# Patient Record
Sex: Female | Born: 1935 | Race: White | Hispanic: No | Smoking: Never smoker
Health system: Southern US, Community
[De-identification: ages and names within clinical notes are randomized; demographics above are authoritative.]

## PROBLEM LIST (undated history)

## (undated) DIAGNOSIS — R221 Localized swelling, mass and lump, neck: Secondary | ICD-10-CM

## (undated) DIAGNOSIS — E785 Hyperlipidemia, unspecified: Secondary | ICD-10-CM

## (undated) DIAGNOSIS — C4491 Basal cell carcinoma of skin, unspecified: Secondary | ICD-10-CM

## (undated) DIAGNOSIS — C8590 Non-Hodgkin lymphoma, unspecified, unspecified site: Secondary | ICD-10-CM

## (undated) DIAGNOSIS — Z6711 Type A blood, Rh negative: Secondary | ICD-10-CM

## (undated) DIAGNOSIS — C541 Malignant neoplasm of endometrium: Secondary | ICD-10-CM

## (undated) DIAGNOSIS — I10 Essential (primary) hypertension: Secondary | ICD-10-CM

## (undated) DIAGNOSIS — I8390 Asymptomatic varicose veins of unspecified lower extremity: Secondary | ICD-10-CM

## (undated) HISTORY — PX: CATARACT EXTRACTION: SUR2

## (undated) HISTORY — PX: VAGINAL HYSTERECTOMY: SHX2639

## (undated) HISTORY — DX: Localized swelling, mass and lump, neck: R22.1

## (undated) HISTORY — DX: Type A blood, Rh negative: Z67.11

## (undated) HISTORY — DX: Hyperlipidemia, unspecified: E78.5

## (undated) HISTORY — DX: Non-Hodgkin lymphoma, unspecified, unspecified site: C85.90

## (undated) HISTORY — PX: ABDOMINAL HYSTERECTOMY: SHX81

## (undated) HISTORY — PX: LYMPH NODE BIOPSY: SHX201

## (undated) HISTORY — DX: Basal cell carcinoma of skin, unspecified: C44.91

## (undated) HISTORY — PX: TONSILLECTOMY: SHX5217

## (undated) HISTORY — DX: Malignant neoplasm of endometrium: C54.1

## (undated) HISTORY — DX: Essential (primary) hypertension: I10

## (undated) HISTORY — DX: Asymptomatic varicose veins of unspecified lower extremity: I83.90

---

## 1997-04-08 ENCOUNTER — Other Ambulatory Visit: Admission: RE | Admit: 1997-04-08 | Discharge: 1997-04-08 | Payer: Self-pay | Admitting: Gynecology

## 1997-12-09 ENCOUNTER — Other Ambulatory Visit: Admission: RE | Admit: 1997-12-09 | Discharge: 1997-12-09 | Payer: Self-pay | Admitting: Gynecology

## 1998-05-26 ENCOUNTER — Other Ambulatory Visit: Admission: RE | Admit: 1998-05-26 | Discharge: 1998-05-26 | Payer: Self-pay | Admitting: Gynecology

## 1998-12-03 ENCOUNTER — Other Ambulatory Visit: Admission: RE | Admit: 1998-12-03 | Discharge: 1998-12-03 | Payer: Self-pay | Admitting: Gynecology

## 1999-05-27 ENCOUNTER — Other Ambulatory Visit: Admission: RE | Admit: 1999-05-27 | Discharge: 1999-05-27 | Payer: Self-pay | Admitting: Gynecology

## 1999-12-14 ENCOUNTER — Other Ambulatory Visit: Admission: RE | Admit: 1999-12-14 | Discharge: 1999-12-14 | Payer: Self-pay | Admitting: Gynecology

## 2000-05-09 ENCOUNTER — Other Ambulatory Visit: Admission: RE | Admit: 2000-05-09 | Discharge: 2000-05-09 | Payer: Self-pay | Admitting: Gynecology

## 2000-08-22 ENCOUNTER — Ambulatory Visit (HOSPITAL_COMMUNITY): Admission: RE | Admit: 2000-08-22 | Discharge: 2000-08-22 | Payer: Self-pay | Admitting: Gastroenterology

## 2001-05-15 ENCOUNTER — Other Ambulatory Visit: Admission: RE | Admit: 2001-05-15 | Discharge: 2001-05-15 | Payer: Self-pay | Admitting: Gynecology

## 2001-09-10 ENCOUNTER — Other Ambulatory Visit: Admission: RE | Admit: 2001-09-10 | Discharge: 2001-09-10 | Payer: Self-pay | Admitting: Gynecology

## 2002-05-19 ENCOUNTER — Other Ambulatory Visit: Admission: RE | Admit: 2002-05-19 | Discharge: 2002-05-19 | Payer: Self-pay | Admitting: Gynecology

## 2003-05-20 ENCOUNTER — Other Ambulatory Visit: Admission: RE | Admit: 2003-05-20 | Discharge: 2003-05-20 | Payer: Self-pay | Admitting: Gynecology

## 2004-05-23 ENCOUNTER — Other Ambulatory Visit: Admission: RE | Admit: 2004-05-23 | Discharge: 2004-05-23 | Payer: Self-pay | Admitting: Gynecology

## 2005-05-31 ENCOUNTER — Other Ambulatory Visit: Admission: RE | Admit: 2005-05-31 | Discharge: 2005-05-31 | Payer: Self-pay | Admitting: Gynecology

## 2006-06-04 ENCOUNTER — Other Ambulatory Visit: Admission: RE | Admit: 2006-06-04 | Discharge: 2006-06-04 | Payer: Self-pay | Admitting: Gynecology

## 2006-08-29 ENCOUNTER — Emergency Department (HOSPITAL_COMMUNITY): Admission: EM | Admit: 2006-08-29 | Discharge: 2006-08-29 | Payer: Self-pay | Admitting: Emergency Medicine

## 2007-11-18 HISTORY — PX: OTHER SURGICAL HISTORY: SHX169

## 2010-05-20 NOTE — Procedures (Signed)
[facility]  Patient:    Melissa Mccormick, Melissa Mccormick Visit Number: [id number] MRN: [id number]          Service Type: Attending:  Verlin Grills, M.D. Proc. Date: 08/22/00   CC:         Al Decant. Janey Greaser, M.D.  Gretta Cool, M.D.   Procedure Report  PROCEDURE:  Colonoscopy  REFERRING PHYSICIAN:  Dr. Doran Clay and Dr. Beather Arbour.  INDICATION FOR PROCEDURE:  Ms. Melissa Mccormick (DOB:  [birth year]) is a 77 year old female who is referred for her first surveillance colonoscopy with polypectomy to prevent colon cancer. I discussed with Ms. Melissa Mccormick the complications associated with colonoscopy and polypectomy including a 15/1000 risk of bleeding and 04/998 risk of colon rupture requiring surgical repair. Ms. Melissa Mccormick has signed the operative permit.  ENDOSCOPIST:  Verlin Grills, M.D.  PREMEDICATION:  Demerol 50 mg, Versed 5 mg.  ENDOSCOPE:  Olympus pediatric colonoscope.  DESCRIPTION OF PROCEDURE:  After obtaining informed consent, the patient was placed in the left lateral decubitus position. I administered intravenous Demerol and intravenous Versed to achieve conscious sedation for the procedure. The patients cardiac rhythm, oxygen saturation and blood pressure were monitored throughout the procedure and documented in the medical record.  Anal inspection was normal. Digital rectal exam was normal. The Olympus pediatric video colonoscope was introduced into the rectum and easily advanced to the cecum. Colonic preparation for the exam today was satisfactory.  [name] has universal colonic diverticulosis but no signs of diverticulitis.  RECTUM:  Normal.  SIGMOID COLON/DESCENDING COLON:  Normal.  SPLENIC FLEXURE:  Normal.  TRANSVERSE COLON:  Normal.  HEPATIC FLEXURE:  Normal.  ASCENDING COLON:  Normal.  CECUM/ILEOCECAL VALVE:  Normal.  ASSESSMENT:  Universal colonic diverticulosis; otherwise normal proctocolonoscopy to the cecum. No  endoscopic evidence for the presence of colorectal neoplasia. Attending:  Verlin Grills, M.D. DD:  08/22/00 TD:  08/22/00 Job: [id number]

## 2010-06-22 ENCOUNTER — Ambulatory Visit
Admission: RE | Admit: 2010-06-22 | Discharge: 2010-06-22 | Disposition: A | Discharge status: Home / Self Care | Payer: Medicare Other | Source: Ambulatory Visit | Attending: Gynecology | Admitting: Gynecology

## 2010-06-22 ENCOUNTER — Other Ambulatory Visit: Payer: Self-pay | Admitting: Gynecology

## 2010-06-22 DIAGNOSIS — R599 Enlarged lymph nodes, unspecified: Secondary | ICD-10-CM

## 2010-07-11 ENCOUNTER — Encounter (INDEPENDENT_AMBULATORY_CARE_PROVIDER_SITE_OTHER): Payer: Self-pay | Admitting: General Surgery

## 2010-07-12 ENCOUNTER — Ambulatory Visit (INDEPENDENT_AMBULATORY_CARE_PROVIDER_SITE_OTHER): Payer: Medicare Other | Admitting: General Surgery

## 2010-07-12 ENCOUNTER — Encounter (INDEPENDENT_AMBULATORY_CARE_PROVIDER_SITE_OTHER): Payer: Self-pay | Admitting: General Surgery

## 2010-07-12 VITALS — BP 142/86 | HR 76 | Temp 97.5°F | Ht 64.0 in | Wt 158.8 lb

## 2010-07-12 DIAGNOSIS — R221 Localized swelling, mass and lump, neck: Secondary | ICD-10-CM

## 2010-07-12 LAB — BASIC METABOLIC PANEL
BUN: 16 mg/dL (ref 6–23)
CO2: 26 mEq/L (ref 19–32)
Chloride: 102 mEq/L (ref 96–112)
Creat: 0.82 mg/dL (ref 0.50–1.10)
Glucose, Bld: 100 mg/dL — ABNORMAL HIGH (ref 70–99)

## 2010-07-12 NOTE — Progress Notes (Signed)
Subjective:     Patient ID: Melissa Mccormick, female   DOB: 13-May-1924, 77 y.o.   MRN: [id number]    BP 142/86  Pulse 76  Temp(Src) 97.5 F (36.4 C) (Temporal)  Ht 5\' 4"  (1.626 m)  Wt 158 lb 12.8 oz (72.031 kg)  BMI 27.26 kg/m2    HPI This is an 77 year old female who presents with a left mastectomy mass for about a year. About 9 months ago she had an upper respiratory tract infection and was seen in urgent care. She had bilateral lymphadenopathy according to her at that point. She was treated with a Z-Pak. She followed up with them about 6 weeks later and still had this lymphadenopathy present. Persistent of present just in one spot in her left posterior neck. This area does not bother her there are no symptoms associated with it. It is mobile. She has no real change in her size. She reports no change in her weight and no night sweats. She reports no fevers associated with this either. She comes in today due to persistence of this lymphadenopathy for evaluation.  Past Medical History  Diagnosis Date  . Hypertension   . Adenocarcinoma of endometrium     Past Surgical History  Procedure Date  . Vaginal hysterectomy     Current Outpatient Prescriptions  Medication Sig Dispense Refill  . aspirin 81 MG tablet Take 81 mg by mouth daily.        . calcium citrate-vitamin D (CITRACAL+D) 315-200 MG-UNIT per tablet Take 1 tablet by mouth 2 (two) times daily.        . diphenhydramine-acetaminophen (TYLENOL PM) 25-500 MG TABS Take 1 tablet by mouth at bedtime as needed.        Marland Kitchen estradiol (VIVELLE-DOT) 0.0375 MG/24HR Place 1 patch onto the skin 2 (two) times a week.        . fluticasone (FLONASE) 50 MCG/ACT nasal spray       . hydrochlorothiazide (,MICROZIDE/HYDRODIURIL,) 12.5 MG capsule       . loratadine (CLARITIN) 10 MG tablet Take 10 mg by mouth daily.        Marland Kitchen losartan (COZAAR) 25 MG tablet       . Multiple Vitamin (MULTI-VITAMIN PO) Take by mouth.        . psyllium (METAMUCIL) 58.6 % powder  Take 1 packet by mouth 3 (three) times daily.        . valACYclovir (VALTREX) 500 MG tablet Take 500 mg by mouth 2 (two) times daily.          Allergies  Allergen Reactions  . Codeine   . Tape     History  Substance Use Topics  . Smoking status: Never Smoker   . Smokeless tobacco: Not on file  . Alcohol Use: No      Review of Systems  All other systems reviewed and are negative.       Objective:   Physical Exam  Constitutional: She appears well-developed and well-nourished.  Neck: Neck supple. No JVD present. No tracheal deviation present. No thyromegaly present.  Cardiovascular: Normal rate, regular rhythm and normal heart sounds.   Pulmonary/Chest: Effort normal and breath sounds normal.  Lymphadenopathy:    She has cervical adenopathy (left posterior triangle with 1 cm palpable mobile nontender node).       Assessment:     Left cervical lymphadenopathy present for one year    Plan:        I discussed with her today that we are  likely to need to excise this area given his persistence well as its appearance. I think this is most likely going to be benign but we can't be sure it less we excise  this. I told her that I would obtain a CT scan of her neck just to make sure this is the only disease is present. She's got a followup with me next week after the CT scan to go over this and likely schedule the procedure.

## 2010-07-14 ENCOUNTER — Ambulatory Visit
Admission: RE | Admit: 2010-07-14 | Discharge: 2010-07-14 | Disposition: A | Discharge status: Home / Self Care | Payer: Medicare Other | Source: Ambulatory Visit | Attending: General Surgery | Admitting: General Surgery

## 2010-07-14 DIAGNOSIS — R221 Localized swelling, mass and lump, neck: Secondary | ICD-10-CM

## 2010-07-14 MED ORDER — IOHEXOL 300 MG/ML  SOLN
75.0000 mL | Freq: Once | INTRAMUSCULAR | Status: AC | PRN
  Administered 2010-07-14: 75 mL via INTRAVENOUS

## 2010-07-15 ENCOUNTER — Telehealth (INDEPENDENT_AMBULATORY_CARE_PROVIDER_SITE_OTHER): Payer: Self-pay | Admitting: General Surgery

## 2010-07-21 ENCOUNTER — Telehealth (INDEPENDENT_AMBULATORY_CARE_PROVIDER_SITE_OTHER): Payer: Self-pay

## 2010-07-21 NOTE — Telephone Encounter (Signed)
Returned voicemail message to pt but had to leave a message notifying her that I would fax a copy of her lab and CT to her medical docotor Dr Juluis Rainier. If any questions to please call me back/[facility] 07-21-10

## 2010-08-08 ENCOUNTER — Ambulatory Visit (INDEPENDENT_AMBULATORY_CARE_PROVIDER_SITE_OTHER): Payer: Medicare Other | Admitting: General Surgery

## 2010-08-08 ENCOUNTER — Encounter (INDEPENDENT_AMBULATORY_CARE_PROVIDER_SITE_OTHER): Payer: Self-pay | Admitting: General Surgery

## 2010-08-08 DIAGNOSIS — R590 Localized enlarged lymph nodes: Secondary | ICD-10-CM

## 2010-08-08 DIAGNOSIS — R599 Enlarged lymph nodes, unspecified: Secondary | ICD-10-CM

## 2010-08-08 NOTE — Progress Notes (Signed)
Melissa Mccormick is a 77 y.o. female.    Chief Complaint  Patient presents with  . Follow-up    HPI HPI This is an 77 year old female who presents with a left neck mass for about a year. About 10 months ago she had an upper respiratory tract infection and was seen in urgent care. She had bilateral lymphadenopathy according to her at that point. She was treated with a Z-Pak. She followed up with them about 6 weeks later and still had this lymphadenopathy present. Persistent of present just in one spot in her left posterior neck. This area does not bother her there are no symptoms associated with it. It is mobile. She has no real change in her size. She reports no change in her weight and no night sweats. She reports no fevers associated with this either. I obtained a ct scan with one abnormal node.  No other abnormalities.  She reports no change since last visit.  She does apparently have an elevated CRP on routine bloodwork that she is seeing Melissa Mccormick for next week.   CT report  Left posterior triangle level III enlarged lymph node  with maximal transverse dimension of 1.2 x 1 cm. This is worrisome  for possibility of malignancy.  There are scattered normal sized lymph nodes throughout the  remainder of neck and the upper mediastinum.  Primary neck mass as a source for the adenopathy is not identified.  Subtle mucosal abnormality not excluded requiring direct  visualization.   Past Medical History  Diagnosis Date  . Hypertension   . Adenocarcinoma of endometrium     Past Surgical History  Procedure Date  . Vaginal hysterectomy     No family history on file.  Social History History  Substance Use Topics  . Smoking status: Never Smoker   . Smokeless tobacco: Not on file  . Alcohol Use: No    Allergies  Allergen Reactions  . Codeine   . Tape     Current Outpatient Prescriptions  Medication Sig Dispense Refill  . aspirin 81 MG tablet Take 81 mg by mouth daily.         . calcium citrate-vitamin D (CITRACAL+D) 315-200 MG-UNIT per tablet Take 1 tablet by mouth 2 (two) times daily.        . diphenhydramine-acetaminophen (TYLENOL PM) 25-500 MG TABS Take 1 tablet by mouth at bedtime.       Marland Kitchen estradiol (VIVELLE-DOT) 0.0375 MG/24HR Place 1 patch onto the skin 2 (two) times a week.        . fluticasone (FLONASE) 50 MCG/ACT nasal spray as needed.       . hydrochlorothiazide (,MICROZIDE/HYDRODIURIL,) 12.5 MG capsule       . loratadine (CLARITIN) 10 MG tablet Take 10 mg by mouth daily.        Marland Kitchen losartan (COZAAR) 25 MG tablet 25 mg daily.       . Multiple Vitamin (MULTI-VITAMIN PO) Take by mouth.        . psyllium (METAMUCIL) 58.6 % powder Take 1 packet by mouth 3 (three) times daily. 1 tsp with water in the morning      . valACYclovir (VALTREX) 500 MG tablet Take 500 mg by mouth as needed.         Review of Systems Review of Systems  All other systems reviewed and are negative.    Physical Exam Physical Exam  Constitutional: She appears well-developed and well-nourished.  Neck: Neck supple.  Cardiovascular: Normal rate, regular rhythm  and normal heart sounds.   Respiratory: Effort normal and breath sounds normal.  Lymphadenopathy:    She has cervical adenopathy (left posterior triangle 1 x1 cm mobile nontender node, no other adenopathy).     There were no vitals taken for this visit.  Assessment/Plan Persistent left neck adenopathy   We discussed a biopsy of this left neck mass. I think this is likely going to be benign but I discussed with her the only way to ensure that would be to excise it. She is perfectly happy with that plan. I discussed risks including but not limited to bleeding, infection, nerve injury. She understands this and will proceed after she sees Melissa Mccormick next week. Melissa Mccormick 08/08/2010, 11:21 AM

## 2010-08-19 ENCOUNTER — Telehealth (INDEPENDENT_AMBULATORY_CARE_PROVIDER_SITE_OTHER): Payer: Self-pay

## 2010-08-19 NOTE — Telephone Encounter (Signed)
[name] for pt letting her know that I did contact her doctor's office Dr Anne Fu requesting clearance on her to be faxed for her surgery on 09-02-10. I notified Dr Judd Gaudier office to fax clearance ASAP b/c her surgery is for 09-02-10.Melissa Mccormick

## 2010-08-22 ENCOUNTER — Telehealth (INDEPENDENT_AMBULATORY_CARE_PROVIDER_SITE_OTHER): Payer: Self-pay

## 2010-08-22 NOTE — Telephone Encounter (Signed)
Pt called to notify her that we did receive clearance from Dr Anne Fu for her surgery posted on 09-02-10./ [facility]

## 2010-08-30 ENCOUNTER — Encounter (HOSPITAL_BASED_OUTPATIENT_CLINIC_OR_DEPARTMENT_OTHER)
Admission: RE | Admit: 2010-08-30 | Discharge: 2010-08-30 | Disposition: A | Discharge status: Home / Self Care | Payer: Medicare Other | Source: Ambulatory Visit | Attending: General Surgery | Admitting: General Surgery

## 2010-08-30 LAB — DIFFERENTIAL
Basophils Absolute: 0 10*3/uL (ref 0.0–0.1)
Basophils Relative: 0 % (ref 0–1)
Eosinophils Absolute: 0.1 10*3/uL (ref 0.0–0.7)
Eosinophils Relative: 1 % (ref 0–5)
Lymphocytes Relative: 31 % (ref 12–46)
Lymphs Abs: 2.9 10*3/uL (ref 0.7–4.0)
Monocytes Absolute: 0.6 10*3/uL (ref 0.1–1.0)
Monocytes Relative: 7 % (ref 3–12)
Neutro Abs: 5.6 10*3/uL (ref 1.7–7.7)
Neutrophils Relative %: 61 % (ref 43–77)

## 2010-08-30 LAB — CBC
HCT: 40.1 % (ref 36.0–46.0)
Hemoglobin: 14 g/dL (ref 12.0–15.0)
MCH: 31.3 pg (ref 26.0–34.0)
MCHC: 34.9 g/dL (ref 30.0–36.0)
MCV: 89.5 fL (ref 78.0–100.0)
Platelets: 217 10*3/uL (ref 150–400)
RBC: 4.48 MIL/uL (ref 3.87–5.11)
RDW: 12.8 % (ref 11.5–15.5)
WBC: 9.3 10*3/uL (ref 4.0–10.5)

## 2010-08-30 LAB — BASIC METABOLIC PANEL
BUN: 16 mg/dL (ref 6–23)
Calcium: 9.7 mg/dL (ref 8.4–10.5)
GFR calc Af Amer: >60 mL/min (ref >60)
GFR calc non Af Amer: >60 mL/min (ref >60)
Potassium: 4.1 mEq/L (ref 3.5–5.1)
Sodium: 139 mEq/L (ref 135–145)

## 2010-09-02 ENCOUNTER — Other Ambulatory Visit (INDEPENDENT_AMBULATORY_CARE_PROVIDER_SITE_OTHER): Payer: Self-pay | Admitting: General Surgery

## 2010-09-02 ENCOUNTER — Ambulatory Visit (HOSPITAL_BASED_OUTPATIENT_CLINIC_OR_DEPARTMENT_OTHER)
Admission: RE | Admit: 2010-09-02 | Discharge: 2010-09-02 | Disposition: A | Discharge status: Home / Self Care | Payer: Medicare Other | Source: Ambulatory Visit | Attending: General Surgery | Admitting: General Surgery

## 2010-09-02 DIAGNOSIS — Z01810 Encounter for preprocedural cardiovascular examination: Secondary | ICD-10-CM | POA: Insufficient documentation

## 2010-09-02 DIAGNOSIS — C8581 Other specified types of non-Hodgkin lymphoma, lymph nodes of head, face, and neck: Secondary | ICD-10-CM

## 2010-09-02 DIAGNOSIS — M129 Arthropathy, unspecified: Secondary | ICD-10-CM | POA: Insufficient documentation

## 2010-09-02 DIAGNOSIS — Z01812 Encounter for preprocedural laboratory examination: Secondary | ICD-10-CM | POA: Insufficient documentation

## 2010-09-02 DIAGNOSIS — R599 Enlarged lymph nodes, unspecified: Secondary | ICD-10-CM | POA: Insufficient documentation

## 2010-09-02 DIAGNOSIS — I10 Essential (primary) hypertension: Secondary | ICD-10-CM | POA: Insufficient documentation

## 2010-09-05 NOTE — Op Note (Signed)
  NAMEAISA, Melissa Mccormick NO.:  [id number]  MEDICAL RECORD NO.:  [id number]  LOCATION:                                 FACILITY:  PHYSICIAN:  Juanetta Gosling, MDDATE OF BIRTH:  07-05-1924  DATE OF PROCEDURE:  09/02/2010 DATE OF DISCHARGE:                              OPERATIVE REPORT   PREOPERATIVE DIAGNOSIS:  Left neck lymphadenopathy, persistent.  POSTOPERATIVE DIAGNOSIS:  Left neck lymphadenopathy, persistent.  PROCEDURE:  Left neck mass excisional biopsy.  SURGEON:  Troy Sine. Dwain Sarna, MD  ASSISTANT:  None.  ANESTHESIA:  General.  SPECIMENS:  Left neck node to Pathology fresh.  ESTIMATED BLOOD LOSS:  Minimal.  COMPLICATIONS:  None.  DRAINS:  None.  DISPOSITION:  The patient to recovery room in stable condition.  INDICATIONS:  An 77 year old female with a left neck mass for about a year when I had seen her she had had an upper respiratory tract infection was treated at that point, but this node has persisted, it is mobile, there was no real change, I did obtain a CT scan that really did show a left posterior triangle level III enlarged node, it did appear worrisome for malignancy.  This was felt on my examination as well.  She and I discussed a biopsy after she was evaluated by both Dr. Anne Fu and [name]  DESCRIPTION OF PROCEDURE:  After informed consent was obtained, the patient was taken to the operating room, she was administered 1 g of intravenous cefazolin.  Sequential compression devices were placed on lower extremities prior to induction with anesthesia.  She was placed under general anesthesia with an endotracheal tube.  She was then prepped and draped in standard sterile surgical fashion.  A surgical time-out was then performed.  I made a vertical incision overlying the lymph node.  I then dissected down using tenotomy scissors to the level of the platysma this was spread open.  I then identified the lymph node which was  carefully removed from the surrounding tissue.  I did place several clips for some draining lymphatics and the lymph node was excised in its entirety.  Hemostasis was observed.  I then closed the platysma 3-0 Vicryl.  The dermis with 3-0 Vicryl.  The skin with 4-0 Monocryl. Dermabond was placed over this.  She tolerated this well, was extubated, transferred to recovery room in stable condition.     Juanetta Gosling, MD     MCW/MEDQ  D:  09/02/2010  T:  09/02/2010  Job:  [id number]  cc:   Dr. Harmon Dun, MD  Electronically Signed by Emelia Loron MD on 09/05/2010 81:19:14 PM

## 2010-09-08 ENCOUNTER — Other Ambulatory Visit (INDEPENDENT_AMBULATORY_CARE_PROVIDER_SITE_OTHER): Payer: Self-pay | Admitting: General Surgery

## 2010-09-08 DIAGNOSIS — R221 Localized swelling, mass and lump, neck: Secondary | ICD-10-CM

## 2010-09-09 ENCOUNTER — Other Ambulatory Visit (INDEPENDENT_AMBULATORY_CARE_PROVIDER_SITE_OTHER): Payer: Self-pay | Admitting: General Surgery

## 2010-09-09 DIAGNOSIS — C8590 Non-Hodgkin lymphoma, unspecified, unspecified site: Secondary | ICD-10-CM

## 2010-09-14 ENCOUNTER — Ambulatory Visit (HOSPITAL_COMMUNITY)
Admission: RE | Admit: 2010-09-14 | Discharge: 2010-09-14 | Disposition: A | Discharge status: Home / Self Care | Payer: Medicare Other | Source: Ambulatory Visit | Attending: General Surgery | Admitting: General Surgery

## 2010-09-14 DIAGNOSIS — J984 Other disorders of lung: Secondary | ICD-10-CM | POA: Insufficient documentation

## 2010-09-14 DIAGNOSIS — C8590 Non-Hodgkin lymphoma, unspecified, unspecified site: Secondary | ICD-10-CM

## 2010-09-14 DIAGNOSIS — K7689 Other specified diseases of liver: Secondary | ICD-10-CM | POA: Insufficient documentation

## 2010-09-14 DIAGNOSIS — C8589 Other specified types of non-Hodgkin lymphoma, extranodal and solid organ sites: Secondary | ICD-10-CM | POA: Insufficient documentation

## 2010-09-14 DIAGNOSIS — K8689 Other specified diseases of pancreas: Secondary | ICD-10-CM | POA: Insufficient documentation

## 2010-09-14 DIAGNOSIS — K5730 Diverticulosis of large intestine without perforation or abscess without bleeding: Secondary | ICD-10-CM | POA: Insufficient documentation

## 2010-09-14 DIAGNOSIS — I517 Cardiomegaly: Secondary | ICD-10-CM | POA: Insufficient documentation

## 2010-09-14 MED ORDER — IOHEXOL 300 MG/ML  SOLN
100.0000 mL | Freq: Once | INTRAMUSCULAR | Status: AC | PRN
  Administered 2010-09-14: 100 mL via INTRAVENOUS

## 2010-09-15 ENCOUNTER — Encounter: Payer: Medicare Other | Admitting: Oncology

## 2010-09-16 ENCOUNTER — Other Ambulatory Visit: Payer: Self-pay | Admitting: Oncology

## 2010-09-16 ENCOUNTER — Encounter (HOSPITAL_BASED_OUTPATIENT_CLINIC_OR_DEPARTMENT_OTHER): Payer: Medicare Other | Admitting: Oncology

## 2010-09-16 DIAGNOSIS — C8589 Other specified types of non-Hodgkin lymphoma, extranodal and solid organ sites: Secondary | ICD-10-CM

## 2010-09-16 LAB — COMPREHENSIVE METABOLIC PANEL
ALT: 11 U/L (ref 0–35)
AST: 16 U/L (ref 0–37)
Albumin: 3.9 g/dL (ref 3.5–5.2)
Alkaline Phosphatase: 63 U/L (ref 39–117)
BUN: 16 mg/dL (ref 6–23)
Calcium: 9.9 mg/dL (ref 8.4–10.5)
Chloride: 102 mEq/L (ref 96–112)
Potassium: 4.6 mEq/L (ref 3.5–5.3)
Sodium: 141 mEq/L (ref 135–145)
Total Protein: 6.2 g/dL (ref 6.0–8.3)

## 2010-09-16 LAB — CBC WITH DIFFERENTIAL/PLATELET
Basophils Absolute: 0 10*3/uL (ref 0.0–0.1)
EOS%: 1.3 % (ref 0.0–7.0)
HGB: 14.1 g/dL (ref 11.6–15.9)
MCH: 31.7 pg (ref 25.1–34.0)
NEUT#: 5 10*3/uL (ref 1.5–6.5)
RBC: 4.46 10*6/uL (ref 3.70–5.45)
RDW: 13.2 % (ref 11.2–14.5)
lymph#: 2 10*3/uL (ref 0.9–3.3)

## 2010-09-22 ENCOUNTER — Encounter (INDEPENDENT_AMBULATORY_CARE_PROVIDER_SITE_OTHER): Payer: Self-pay | Admitting: General Surgery

## 2010-09-22 ENCOUNTER — Ambulatory Visit (INDEPENDENT_AMBULATORY_CARE_PROVIDER_SITE_OTHER): Payer: Medicare Other | Admitting: General Surgery

## 2010-09-22 VITALS — BP 144/86 | HR 68 | Temp 97.1°F | Resp 16 | Ht 64.5 in | Wt 155.0 lb

## 2010-09-22 DIAGNOSIS — Z09 Encounter for follow-up examination after completed treatment for conditions other than malignant neoplasm: Secondary | ICD-10-CM

## 2010-09-22 NOTE — Progress Notes (Signed)
Subjective:     Patient ID: Melissa Mccormick, female   DOB: 1924-11-28, 77 y.o.   MRN: [id number]  HPI This is an 77 year old female who had a persistent left neck node. I biopsied this recently. She's had no problems since her operation. Her pathology did show a non-Hodgkin's lymphoma. CT scan of her neck chest abdomen and pelvis showed no other disease. She has been seen by Dr. Clelia Croft and is going to be followed from here.  Review of Systems     Objective:   Physical Exam Well healing left neck incision without infection    Assessment:     S/p left neck node biopsy Non-Hodgkins lymphoma    Plan:     Return to full activity May get her flu shot per her request Return as needed

## 2010-10-14 LAB — POCT CARDIAC MARKERS
Myoglobin, poc: 81.3
Operator id: 234501

## 2010-10-14 LAB — I-STAT 8, (EC8 V) (CONVERTED LAB)
BUN: 14
Chloride: 103
Glucose, Bld: 99
pCO2, Ven: 59.8 — ABNORMAL HIGH
pH, Ven: 7.331 — ABNORMAL HIGH

## 2010-10-14 LAB — POCT I-STAT CREATININE
Creatinine, Ser: 0.9
Operator id: 234501

## 2010-10-14 LAB — DIFFERENTIAL
Basophils Absolute: 0.1
Eosinophils Absolute: 0
Eosinophils Relative: 0
Lymphocytes Relative: 15
Neutrophils Relative %: 79 — ABNORMAL HIGH

## 2010-10-14 LAB — CBC
MCHC: 34.3
MCV: 91.3
Platelets: 236
RDW: 13.6

## 2010-10-14 LAB — HEPATITIS B SURFACE ANTIBODY,QUALITATIVE: Hep B S Ab: POSITIVE — AB

## 2010-12-20 ENCOUNTER — Ambulatory Visit (HOSPITAL_BASED_OUTPATIENT_CLINIC_OR_DEPARTMENT_OTHER): Payer: Medicare Other | Admitting: Oncology

## 2010-12-20 ENCOUNTER — Other Ambulatory Visit: Payer: Self-pay | Admitting: Oncology

## 2010-12-20 ENCOUNTER — Encounter: Payer: Self-pay | Admitting: *Deleted

## 2010-12-20 ENCOUNTER — Other Ambulatory Visit (HOSPITAL_BASED_OUTPATIENT_CLINIC_OR_DEPARTMENT_OTHER): Payer: Medicare Other | Admitting: Lab

## 2010-12-20 VITALS — BP 186/81 | HR 79 | Temp 97.2°F | Ht 64.5 in | Wt 155.0 lb

## 2010-12-20 DIAGNOSIS — C8590 Non-Hodgkin lymphoma, unspecified, unspecified site: Secondary | ICD-10-CM

## 2010-12-20 DIAGNOSIS — C8589 Other specified types of non-Hodgkin lymphoma, extranodal and solid organ sites: Secondary | ICD-10-CM

## 2010-12-20 LAB — CBC WITH DIFFERENTIAL/PLATELET
Eosinophils Absolute: 0.1 10*3/uL (ref 0.0–0.5)
LYMPH%: 31.9 % (ref 14.0–49.7)
MCHC: 34.9 g/dL (ref 31.5–36.0)
MCV: 91.1 fL (ref 79.5–101.0)
MONO%: 8.8 % (ref 0.0–14.0)
NEUT#: 4.6 10*3/uL (ref 1.5–6.5)
Platelets: 203 10*3/uL (ref 145–400)
RBC: 4.48 10*6/uL (ref 3.70–5.45)

## 2010-12-20 LAB — COMPREHENSIVE METABOLIC PANEL
Alkaline Phosphatase: 65 U/L (ref 39–117)
Glucose, Bld: 96 mg/dL (ref 70–99)
Sodium: 137 mEq/L (ref 135–145)
Total Bilirubin: 0.9 mg/dL (ref 0.3–1.2)
Total Protein: 6.4 g/dL (ref 6.0–8.3)

## 2010-12-20 NOTE — Progress Notes (Signed)
Hematology and Oncology Follow Up Visit  Amil Moseman [id number] 12/04/1924 77 y.o. 12/20/2010 12:25 PM    CC: Juanetta Gosling, MD  Dossie Der, MD  Jake Bathe, MD    Principle Diagnosis: This is an 77 year old female with stage 1A low-grade non-Hodgkin's lymphoma, marginal zone subtype diagnosed in 08/2010.   Prior Therapy: The patient is S/P an excisional biopsy that was done on September 02, 2010.  The case number was [id number] which showed a CD20 positive low grade non-Hodgkin's lymphoma and marginal zone subtype that is low grade and as mentioned showed a CD20 positive, CD79 positive without coexpression of CD5 and CD10.   Current therapy: Observation and surveillance.   Interim History:  The patient presents today for follow up for her of lymphoma and clinically feels well.  She is not reporting any chest pain.  She is not reporting any difficulty breathing.  Did not report any symptomatic dysphagia.  Did not report any fevers, chills, night sweats or weight loss.  Her energy performance status remains excellent.  She continues to live independently and attends to activities of daily living without any hindrance or decline. She continue to exercise regularly  without any difficulty.  Medications: I have reviewed the patient's current medications. Current outpatient prescriptions:aspirin 81 MG tablet, Take 81 mg by mouth daily.  , Disp: , Rfl: ;  calcium citrate-vitamin D (CITRACAL+D) 315-200 MG-UNIT per tablet, Take 1 tablet by mouth 2 (two) times daily.  , Disp: , Rfl: ;  diphenhydramine-acetaminophen (TYLENOL PM) 25-500 MG TABS, Take 1 tablet by mouth at bedtime. , Disp: , Rfl: ;  estradiol (VIVELLE-DOT) 0.0375 MG/24HR, Place 1 patch onto the skin 2 (two) times a week.  , Disp: , Rfl:  fluticasone (FLONASE) 50 MCG/ACT nasal spray, as needed. , Disp: , Rfl: ;  hydrochlorothiazide (,MICROZIDE/HYDRODIURIL,) 12.5 MG capsule, , Disp: , Rfl: ;  loratadine (CLARITIN) 10 MG tablet,  Take 10 mg by mouth daily.  , Disp: , Rfl: ;  losartan (COZAAR) 25 MG tablet, 25 mg daily. , Disp: , Rfl: ;  Multiple Vitamin (MULTI-VITAMIN PO), Take by mouth.  , Disp: , Rfl:  psyllium (METAMUCIL) 58.6 % powder, Take 1 packet by mouth 3 (three) times daily. 1 tsp with water in the morning, Disp: , Rfl: ;  valACYclovir (VALTREX) 500 MG tablet, Take 500 mg by mouth as needed. , Disp: , Rfl:   Allergies:  Allergies  Allergen Reactions  . Codeine   . Tape     Past Medical History, Surgical history, Social history, and Family History were reviewed and updated.  Review of Systems: Constitutional:  Negative for fever, chills, night sweats, anorexia, weight loss, pain. Cardiovascular: no chest pain or dyspnea on exertion Respiratory: no cough, shortness of breath, or wheezing Neurological: no TIA or stroke symptoms Dermatological: negative ENT: negative Skin: Negative. Gastrointestinal: no abdominal pain, change in bowel habits, or black or bloody stools Genito-Urinary: no dysuria, trouble voiding, or hematuria Hematological and Lymphatic: negative Breast: negative Musculoskeletal: negative Remaining ROS negative. Physical Exam: Blood pressure 186/81, pulse 79, temperature 97.2 F (36.2 C), temperature source Oral, height 5' 4.5" (1.638 m), weight 155 lb (70.308 kg). ECOG: 1 General appearance: alert Head: Normocephalic, without obvious abnormality, atraumatic Neck: no adenopathy, no carotid bruit, no JVD, supple, symmetrical, trachea midline and thyroid not enlarged, symmetric, no tenderness/mass/nodules Lymph nodes: Cervical, supraclavicular, and axillary nodes normal. Heart:regular rate and rhythm, S1, S2 normal, no murmur, click, rub or gallop Lung:chest clear, no  wheezing, rales, normal symmetric air entry Abdomin: soft, non-tender, without masses or organomegaly EXT:no erythema, induration, or nodules   Lab Results: Lab Results  Component Value Date   WBC 8.0 12/20/2010    HGB 14.3 12/20/2010   HCT 40.8 12/20/2010   MCV 91.1 12/20/2010   PLT 203 12/20/2010     Chemistry      Component Value Date/Time   NA 141 09/16/2010 1045   K 4.6 09/16/2010 1045   CL 102 09/16/2010 1045   CO2 31 09/16/2010 1045   BUN 16 09/16/2010 1045   CREATININE 0.89 09/16/2010 1045   CREATININE 0.82 07/12/2010 1001      Component Value Date/Time   CALCIUM 9.9 09/16/2010 1045   ALKPHOS 63 09/16/2010 1045   AST 16 09/16/2010 1045   ALT 11 09/16/2010 1045   BILITOT 0.8 09/16/2010 1045        Impression and Plan:   This is a pleasant 77 year old female with the following diagnosis:   1. Stage 1A low-grade non-Hodgkin's lymphoma, marginal zone subtype. Currently on observation. No indication for treatment at this time. I continued to educate her about symptoms of advanced lymphoma and indication of treatment for low grade lymphoma. She has non of these issues at this time. Plan for follow up in 4 months and repeat images in 8 months.  2. Remote history of the uterus: not active at this time.   3. Hypertension: followed by her PCP.  [facility], MD 12/18/201212:25 PM

## 2011-04-20 ENCOUNTER — Other Ambulatory Visit (HOSPITAL_BASED_OUTPATIENT_CLINIC_OR_DEPARTMENT_OTHER): Payer: Medicare Other | Admitting: Lab

## 2011-04-20 ENCOUNTER — Telehealth: Payer: Self-pay | Admitting: Oncology

## 2011-04-20 ENCOUNTER — Ambulatory Visit (HOSPITAL_BASED_OUTPATIENT_CLINIC_OR_DEPARTMENT_OTHER): Payer: Medicare Other | Admitting: Oncology

## 2011-04-20 VITALS — BP 153/80 | HR 82 | Temp 97.0°F | Ht 64.5 in | Wt 157.0 lb

## 2011-04-20 DIAGNOSIS — C8589 Other specified types of non-Hodgkin lymphoma, extranodal and solid organ sites: Secondary | ICD-10-CM

## 2011-04-20 DIAGNOSIS — C8590 Non-Hodgkin lymphoma, unspecified, unspecified site: Secondary | ICD-10-CM

## 2011-04-20 LAB — CBC WITH DIFFERENTIAL/PLATELET
BASO%: 0.6 % (ref 0.0–2.0)
Eosinophils Absolute: 0.2 10*3/uL (ref 0.0–0.5)
MONO#: 0.8 10*3/uL (ref 0.1–0.9)
NEUT#: 4.4 10*3/uL (ref 1.5–6.5)
RBC: 4.32 10*6/uL (ref 3.70–5.45)
RDW: 13.6 % (ref 11.2–14.5)
WBC: 8.8 10*3/uL (ref 3.9–10.3)
lymph#: 3.4 10*3/uL — ABNORMAL HIGH (ref 0.9–3.3)
nRBC: 0 % (ref 0–0)

## 2011-04-20 LAB — COMPREHENSIVE METABOLIC PANEL
Albumin: 3.8 g/dL (ref 3.5–5.2)
CO2: 30 mEq/L (ref 19–32)
Calcium: 9.1 mg/dL (ref 8.4–10.5)
Chloride: 104 mEq/L (ref 96–112)
Glucose, Bld: 86 mg/dL (ref 70–99)
Potassium: 3.9 mEq/L (ref 3.5–5.3)
Sodium: 143 mEq/L (ref 135–145)
Total Bilirubin: 0.7 mg/dL (ref 0.3–1.2)
Total Protein: 6.1 g/dL (ref 6.0–8.3)

## 2011-04-20 NOTE — Telephone Encounter (Signed)
appts made and printed for pt aom °

## 2011-04-20 NOTE — Progress Notes (Signed)
Hematology and Oncology Follow Up Visit  Melissa Mccormick [id number] Feb 21, 1924 78 y.o. 04/20/2011 3:17 PM    CC: Juanetta Gosling, MD  Dossie Der, MD  Jake Bathe, MD    Principle Diagnosis: This is an 78 year old female with stage 1A low-grade non-Hodgkin's lymphoma, marginal zone subtype diagnosed in 08/2010.   Prior Therapy: The patient is S/P an excisional biopsy that was done on September 02, 2010.  The case number was [id number] which showed a CD20 positive low grade non-Hodgkin's lymphoma and marginal zone subtype that is low grade and as mentioned showed a CD20 positive, CD79 positive without coexpression of CD5 and CD10.   Current therapy: Observation and surveillance.   Interim History:  The patient presents today for follow up for her of lymphoma and clinically feels well.  She is not reporting any chest pain.  She is not reporting any difficulty breathing.  Did not report any symptomatic dysphagia.  Did not report any fevers, chills, night sweats or weight loss.  Her energy performance status remains excellent.  She continues to live independently and attends to activities of daily living without any hindrance or decline. She continue to exercise regularly  without any difficulty. She is completely symptom free.   Medications: I have reviewed the patient's current medications. Current outpatient prescriptions:aspirin 81 MG tablet, Take 81 mg by mouth daily.  , Disp: , Rfl: ;  calcium citrate-vitamin D (CITRACAL+D) 315-200 MG-UNIT per tablet, Take 1 tablet by mouth 2 (two) times daily.  , Disp: , Rfl: ;  Carboxymethylcellulose Sodium (REFRESH OP), Apply to eye as needed.  , Disp: , Rfl: ;  Cholecalciferol (VITAMIN D-3 PO), Take 2,000 Int'l Units by mouth daily.  , Disp: , Rfl:  diphenhydramine-acetaminophen (TYLENOL PM) 25-500 MG TABS, Take 1 tablet by mouth at bedtime. , Disp: , Rfl: ;  estradiol (VIVELLE-DOT) 0.0375 MG/24HR, Place 1 patch onto the skin 2 (two) times a week.  ,  Disp: , Rfl: ;  fluticasone (FLONASE) 50 MCG/ACT nasal spray, as needed. , Disp: , Rfl: ;  loratadine (CLARITIN) 10 MG tablet, Take 10 mg by mouth daily. , Disp: , Rfl:  losartan-hydrochlorothiazide (HYZAAR) 50-12.5 MG per tablet, Take 1 tablet by mouth daily.  , Disp: , Rfl: ;  Multiple Vitamin (MULTI-VITAMIN PO), Take by mouth.  , Disp: , Rfl: ;  OMEGA-3 KRILL OIL 300 MG CAPS, Take 300 mg by mouth daily.  , Disp: , Rfl: ;  OVER THE COUNTER MEDICATION, Take by mouth as needed. Emergen C mix when exposed to colds or flu , Disp: , Rfl:  psyllium (METAMUCIL) 58.6 % powder, Take 1 packet by mouth 3 (three) times daily. 1 tsp with water in the morning, Disp: , Rfl: ;  valACYclovir (VALTREX) 500 MG tablet, Take 500 mg by mouth as needed. , Disp: , Rfl:   Allergies:  Allergies  Allergen Reactions  . Codeine   . Tape     Past Medical History, Surgical history, Social history, and Family History were reviewed and updated.  Review of Systems: Constitutional:  Negative for fever, chills, night sweats, anorexia, weight loss, pain. Cardiovascular: no chest pain or dyspnea on exertion Respiratory: no cough, shortness of breath, or wheezing Neurological: no TIA or stroke symptoms Dermatological: negative ENT: negative Skin: Negative. Gastrointestinal: no abdominal pain, change in bowel habits, or black or bloody stools Genito-Urinary: no dysuria, trouble voiding, or hematuria Hematological and Lymphatic: negative Breast: negative Musculoskeletal: negative Remaining ROS negative. Physical Exam: Blood pressure  153/80, pulse 82, temperature 97 F (36.1 C), temperature source Oral, height 5' 4.5" (1.638 m), weight 157 lb (71.215 kg). ECOG: 1 General appearance: alert Head: Normocephalic, without obvious abnormality, atraumatic Neck: no adenopathy, no carotid bruit, no JVD, supple, symmetrical, trachea midline and thyroid not enlarged, symmetric, no tenderness/mass/nodules Lymph nodes: Cervical,  supraclavicular, and axillary nodes normal. Heart:regular rate and rhythm, S1, S2 normal, no murmur, click, rub or gallop Lung:chest clear, no wheezing, rales, normal symmetric air entry Abdomin: soft, non-tender, without masses or organomegaly EXT:no erythema, induration, or nodules   Lab Results: Lab Results  Component Value Date   WBC 8.8 04/20/2011   HGB 13.3 04/20/2011   HCT 39.7 04/20/2011   MCV 91.8 04/20/2011   PLT 202 04/20/2011      Impression and Plan:   This is a pleasant 78 year old female with the following diagnosis:   1. Stage 1A low-grade non-Hodgkin's lymphoma, marginal zone subtype. Currently on observation. No indication for treatment at this time. I continued to educate her about symptoms of advanced lymphoma and indication of treatment for low grade lymphoma. She has non of these issues at this time. Plan for follow up in 4 months and repeat images at that time.  2. Remote history of the uterus: not active at this time.   3. Hypertension: followed by her PCP.  [facility], MD 4/18/20133:17 PM

## 2011-08-17 ENCOUNTER — Telehealth: Payer: Self-pay | Admitting: Oncology

## 2011-08-17 NOTE — Telephone Encounter (Signed)
Moved 8/29 appt to 10/2 - FS vac. S/w pt re new time and pt will KSA for lb/ct 8/26.

## 2011-08-28 ENCOUNTER — Ambulatory Visit (HOSPITAL_COMMUNITY)
Admission: RE | Admit: 2011-08-28 | Discharge: 2011-08-28 | Disposition: A | Discharge status: Home / Self Care | Payer: Medicare Other | Source: Ambulatory Visit | Attending: Oncology | Admitting: Oncology

## 2011-08-28 ENCOUNTER — Other Ambulatory Visit (HOSPITAL_BASED_OUTPATIENT_CLINIC_OR_DEPARTMENT_OTHER): Payer: Medicare Other | Admitting: Lab

## 2011-08-28 DIAGNOSIS — C8589 Other specified types of non-Hodgkin lymphoma, extranodal and solid organ sites: Secondary | ICD-10-CM

## 2011-08-28 DIAGNOSIS — C8590 Non-Hodgkin lymphoma, unspecified, unspecified site: Secondary | ICD-10-CM

## 2011-08-28 DIAGNOSIS — K449 Diaphragmatic hernia without obstruction or gangrene: Secondary | ICD-10-CM | POA: Insufficient documentation

## 2011-08-28 DIAGNOSIS — K5730 Diverticulosis of large intestine without perforation or abscess without bleeding: Secondary | ICD-10-CM | POA: Insufficient documentation

## 2011-08-28 DIAGNOSIS — K7689 Other specified diseases of liver: Secondary | ICD-10-CM | POA: Insufficient documentation

## 2011-08-28 DIAGNOSIS — K8020 Calculus of gallbladder without cholecystitis without obstruction: Secondary | ICD-10-CM | POA: Insufficient documentation

## 2011-08-28 LAB — CBC WITH DIFFERENTIAL/PLATELET
BASO%: 0.6 % (ref 0.0–2.0)
Basophils Absolute: 0.1 10*3/uL (ref 0.0–0.1)
HCT: 40.7 % (ref 34.8–46.6)
HGB: 13.9 g/dL (ref 11.6–15.9)
MCHC: 34.1 g/dL (ref 31.5–36.0)
MONO#: 0.7 10*3/uL (ref 0.1–0.9)
NEUT%: 50.4 % (ref 38.4–76.8)
RDW: 13.6 % (ref 11.2–14.5)
WBC: 10.8 10*3/uL — ABNORMAL HIGH (ref 3.9–10.3)
lymph#: 4.4 10*3/uL — ABNORMAL HIGH (ref 0.9–3.3)

## 2011-08-28 LAB — COMPREHENSIVE METABOLIC PANEL (CC13)
Albumin: 3.6 g/dL (ref 3.5–5.0)
CO2: 28 mEq/L (ref 22–29)
Calcium: 9.9 mg/dL (ref 8.4–10.4)
Chloride: 102 mEq/L (ref 98–107)
Creatinine: 0.9 mg/dL (ref 0.6–1.1)
Potassium: 4.1 mEq/L (ref 3.5–5.1)
Total Protein: 6.7 g/dL (ref 6.4–8.3)

## 2011-08-28 MED ORDER — IOHEXOL 300 MG/ML  SOLN
100.0000 mL | Freq: Once | INTRAMUSCULAR | Status: AC | PRN
  Administered 2011-08-28: 100 mL via INTRAVENOUS

## 2011-08-31 ENCOUNTER — Ambulatory Visit: Payer: Medicare Other | Admitting: Oncology

## 2011-10-04 ENCOUNTER — Telehealth: Payer: Self-pay | Admitting: Oncology

## 2011-10-04 ENCOUNTER — Ambulatory Visit (HOSPITAL_BASED_OUTPATIENT_CLINIC_OR_DEPARTMENT_OTHER): Payer: Medicare Other | Admitting: Oncology

## 2011-10-04 VITALS — BP 168/84 | HR 91 | Temp 98.0°F | Resp 20 | Ht 65.5 in | Wt 156.5 lb

## 2011-10-04 DIAGNOSIS — R221 Localized swelling, mass and lump, neck: Secondary | ICD-10-CM

## 2011-10-04 DIAGNOSIS — C8589 Other specified types of non-Hodgkin lymphoma, extranodal and solid organ sites: Secondary | ICD-10-CM

## 2011-10-04 NOTE — Progress Notes (Signed)
Hematology and Oncology Follow Up Visit  Melissa Mccormick [id number] 11/20/1924 78 y.o. 10/04/2011 3:18 PM    CC: Juanetta Gosling, MD  Dossie Der, MD  Jake Bathe, MD    Principle Diagnosis: This is an 78 year old female with stage 1A low-grade non-Hodgkin's lymphoma, marginal zone subtype diagnosed in 08/2010.  Prior Therapy: The patient is S/P an excisional biopsy that was done on September 02, 2010.  The case number was [id number] which showed a CD20 positive low grade non-Hodgkin's lymphoma and marginal zone subtype that is low grade and as mentioned showed a CD20 positive, CD79 positive without coexpression of CD5 and CD10.   Current therapy: Observation and surveillance.   Interim History:  The patient presents today for follow up for her of lymphoma and clinically feels well.  She is not reporting any chest pain.  She is not reporting any difficulty breathing.  Did not report any symptomatic dysphagia.  Did not report any fevers, chills, night sweats or weight loss.  Her energy performance status remains excellent.  She continues to live independently and attends to activities of daily living without any hindrance or decline. She continue to exercise regularly  without any difficulty. She is completely symptom free at this time.  Medications: I have reviewed the patient's current medications. Current outpatient prescriptions:aspirin 81 MG tablet, Take 81 mg by mouth daily.  , Disp: , Rfl: ;  calcium citrate-vitamin D (CITRACAL+D) 315-200 MG-UNIT per tablet, Take 1 tablet by mouth 2 (two) times daily.  , Disp: , Rfl: ;  Carboxymethylcellulose Sodium (REFRESH OP), Apply to eye as needed.  , Disp: , Rfl: ;  Cholecalciferol (VITAMIN D-3 PO), Take 2,000 Int'l Units by mouth daily.  , Disp: , Rfl:  diphenhydramine-acetaminophen (TYLENOL PM) 25-500 MG TABS, Take 1 tablet by mouth at bedtime. , Disp: , Rfl: ;  estradiol (VIVELLE-DOT) 0.0375 MG/24HR, Place 1 patch onto the skin 2 (two) times a  week.  , Disp: , Rfl: ;  fluticasone (FLONASE) 50 MCG/ACT nasal spray, as needed. , Disp: , Rfl: ;  loratadine (CLARITIN) 10 MG tablet, Take 10 mg by mouth daily. , Disp: , Rfl:  losartan-hydrochlorothiazide (HYZAAR) 50-12.5 MG per tablet, Take 1 tablet by mouth daily.  , Disp: , Rfl: ;  Multiple Vitamin (MULTI-VITAMIN PO), Take by mouth.  , Disp: , Rfl: ;  OMEGA-3 KRILL OIL 300 MG CAPS, Take 300 mg by mouth daily.  , Disp: , Rfl: ;  OVER THE COUNTER MEDICATION, Take by mouth as needed. Emergen C mix when exposed to colds or flu , Disp: , Rfl:  psyllium (METAMUCIL) 58.6 % powder, Take 1 packet by mouth 3 (three) times daily. 1 tsp with water in the morning, Disp: , Rfl: ;  valACYclovir (VALTREX) 500 MG tablet, Take 500 mg by mouth as needed. , Disp: , Rfl:   Allergies:  Allergies  Allergen Reactions  . Codeine   . Tape     Past Medical History, Surgical history, Social history, and Family History were reviewed and updated.  Review of Systems: Constitutional:  Negative for fever, chills, night sweats, anorexia, weight loss, pain. Cardiovascular: no chest pain or dyspnea on exertion Respiratory: no cough, shortness of breath, or wheezing Neurological: no TIA or stroke symptoms Dermatological: negative ENT: negative Skin: Negative. Gastrointestinal: no abdominal pain, change in bowel habits, or black or bloody stools Genito-Urinary: no dysuria, trouble voiding, or hematuria Hematological and Lymphatic: negative Breast: negative Musculoskeletal: negative Remaining ROS negative. Physical Exam: Blood  pressure 168/84, pulse 91, temperature 98 F (36.7 C), temperature source Oral, resp. rate 20, height 5' 5.5" (1.664 m), weight 156 lb 8 oz (70.988 kg). ECOG: 1 General appearance: alert Head: Normocephalic, without obvious abnormality, atraumatic Neck: no adenopathy, no carotid bruit, no JVD, supple, symmetrical, trachea midline and thyroid not enlarged, symmetric, no  tenderness/mass/nodules Lymph nodes: Cervical, supraclavicular, and axillary nodes normal. Heart:regular rate and rhythm, S1, S2 normal, no murmur, click, rub or gallop Lung:chest clear, no wheezing, rales, normal symmetric air entry Abdomin: soft, non-tender, without masses or organomegaly EXT:no erythema, induration, or nodules   Lab Results: Lab Results  Component Value Date   WBC 10.8* 08/28/2011   HGB 13.9 08/28/2011   HCT 40.7 08/28/2011   MCV 90.7 08/28/2011   PLT 203 08/28/2011    CT CHEST, ABDOMEN AND PELVIS WITH CONTRAST  Technique: Multidetector CT imaging of the chest, abdomen and  pelvis was performed following the standard protocol during bolus  administration of intravenous contrast.  Contrast: OMNIPAQUE IOHEXOL 300 MG/ML SOLN  Comparison: CT 09/14/2010  CT CHEST  Findings: No axillary or supraclavicular lymphadenopathy. No  mediastinal or hilar lymphadenopathy. No pericardial fluid. No  suspicious pulmonary nodules. There is a mild branching nodular  pattern within the lingula with associate bronchiectasis which  suggest chronic infection. This finding and is similar to prior.  IMPRESSION:  1. No evidence of lymphoma.  2. Chronic bronchiectasis and nodularity in the right middle lobe  consistent with chronic infection. Consider MAI.  CT ABDOMEN AND PELVIS  Findings: There is enhancing lesion in the right hepatic lobe  measuring 3.0 x 1.7 cm unchanged from prior. This is most  consistent a benign vascular structure such as a varix. There is  small cystic lesions which are stable. Gallbladder obtains a small  gallstone. The pancreas, spleen, adrenal glands, and kidneys are  normal.  The stomach is normal. Small hiatal hernia. The small bowel and  colon show no acute findings. There is extensive diverticular  disease of the sigmoid colon and descending colon without acute  inflammation.  Abdominal aorta normal caliber. No retroperitoneal or mesenteric   lymphadenopathy.  No free fluid the pelvis. Post hysterectomy anatomy. No pelvic  lymphadenopathy. Review of bone windows demonstrates no aggressive  osseous lesions.  IMPRESSION:  1. No evidence of lymphoma progression.  2. Stable enhancing lesion in the right hepatic lobe appears  vascular / benign.  3. Extensive colonic and sigmoid diverticulosis without evidence  of acute diverticulitis.    Impression and Plan:   This is a pleasant 78 year old female with the following diagnosis:   1. Stage 1A low-grade non-Hodgkin's lymphoma, marginal zone subtype. Currently on observation. Her CT scans reviewed and showed no disease at this time. No indication for treatment at this time. I continued to educate her about symptoms of advanced lymphoma and indication of treatment for low grade lymphoma. She has non of these issues at this time. Plan for follow up in 12 months and repeat images as needed  2. Remote history of the uterus: not active at this time.   3. Hypertension: followed by her PCP.  Aanika Defoor, MD 10/2/20133:18 PM

## 2011-10-04 NOTE — Telephone Encounter (Signed)
lvm for pt regarding 10.2.14 appt.....mailed oct 2014 schedule to pt....sed

## 2011-10-06 ENCOUNTER — Telehealth: Payer: Self-pay | Admitting: Oncology

## 2011-10-06 NOTE — Telephone Encounter (Signed)
Returned pt's call re r/s 10/03/12 appt to 10/09/12. gv pt new d/t for 10/09/12.

## 2012-06-25 ENCOUNTER — Ambulatory Visit (INDEPENDENT_AMBULATORY_CARE_PROVIDER_SITE_OTHER): Payer: Medicare Other | Admitting: Gynecology

## 2012-06-25 ENCOUNTER — Encounter: Payer: Self-pay | Admitting: Gynecology

## 2012-06-25 ENCOUNTER — Other Ambulatory Visit (HOSPITAL_COMMUNITY)
Admission: RE | Admit: 2012-06-25 | Discharge: 2012-06-25 | Disposition: A | Discharge status: Home / Self Care | Payer: Medicare Other | Source: Ambulatory Visit | Attending: Gynecology | Admitting: Gynecology

## 2012-06-25 VITALS — BP 148/92 | Ht 63.0 in | Wt 154.0 lb

## 2012-06-25 DIAGNOSIS — C8591 Non-Hodgkin lymphoma, unspecified, lymph nodes of head, face, and neck: Secondary | ICD-10-CM

## 2012-06-25 DIAGNOSIS — C8581 Other specified types of non-Hodgkin lymphoma, lymph nodes of head, face, and neck: Secondary | ICD-10-CM

## 2012-06-25 DIAGNOSIS — Z79890 Hormone replacement therapy (postmenopausal): Secondary | ICD-10-CM

## 2012-06-25 DIAGNOSIS — Z1272 Encounter for screening for malignant neoplasm of vagina: Secondary | ICD-10-CM

## 2012-06-25 DIAGNOSIS — N952 Postmenopausal atrophic vaginitis: Secondary | ICD-10-CM

## 2012-06-25 DIAGNOSIS — Z1151 Encounter for screening for human papillomavirus (HPV): Secondary | ICD-10-CM | POA: Insufficient documentation

## 2012-06-25 DIAGNOSIS — Z01419 Encounter for gynecological examination (general) (routine) without abnormal findings: Secondary | ICD-10-CM | POA: Insufficient documentation

## 2012-06-25 DIAGNOSIS — Z8542 Personal history of malignant neoplasm of other parts of uterus: Secondary | ICD-10-CM | POA: Insufficient documentation

## 2012-06-25 MED ORDER — ESTRADIOL 0.075 MG/24HR TD PTTW: 1.0000 | MEDICATED_PATCH | TRANSDERMAL | Status: DC

## 2012-06-25 NOTE — Progress Notes (Signed)
Melissa Mccormick 05/02/1924 [id number]   History:    79 y.o.  Who presents to the office to get established in our practice and for overdue gynecological exam. Patient was a patient of Melissa Mccormick. His records were reviewed and stated the following: Patient is a G3 P2 presented the primary care of Melissa Mccormick her primary physician. 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 Patient in 2012 was diagnosis stage I lymphoma which originated at the base of her left side of her neck. Dr. Dwain Mccormick the general surgeon had done to biopsy. Patient has done well since then. Patient has been on Vivelle-Dot 0.075 milligram which she cuts in half and applied twice a week for many years. Patient had been counseled in the past a low dose hormone replacement therapy in several studies to include the WHI have been presented to the patient. According to the patient her case and then consulted with GYN oncologist and felt no reservations against her using low-dose transdermal estrogen patch. Patient has past history of osteopenia last bone density study was in 2011. Her last colonoscopy was by Dr. Laural Mccormick in 2002. Patient denies any prior history of any abnormal Pap smear. Her last Pap smear was in 2011. Her last mammogram was in June 2013. Patient states she's up-to-date on her shingles and Tdap vaccine.  Past medical history,surgical history, family history and social history were all reviewed and documented in the EPIC chart.  Gynecologic History No LMP recorded. Patient has had a hysterectomy. Contraception: status post hysterectomy Last Pap: 2011. Results were: normal Last mammogram: 2013. Results were: normal  Obstetric History OB History   Grav Para Term Preterm Abortions TAB SAB Ect Mult Living   3 2   1  1   2      # Outc Date GA Lbr Len/2nd Wgt Sex Del Anes PTL Lv   1 PAR            2 PAR            3 SAB                 ROS: A ROS was performed and pertinent positives and negatives are included in the history.  GENERAL: No fevers or chills. HEENT: No change in vision, no earache, sore throat or sinus congestion. NECK: No pain or stiffness. CARDIOVASCULAR: No chest pain or pressure. No palpitations. PULMONARY: No shortness of breath, cough or wheeze. GASTROINTESTINAL: No abdominal pain, nausea, vomiting or diarrhea, melena or bright red blood per rectum. GENITOURINARY: No urinary frequency, urgency, hesitancy or dysuria. MUSCULOSKELETAL: No joint or muscle pain, no back pain, no recent trauma. DERMATOLOGIC: No rash, no itching, no lesions. ENDOCRINE: No polyuria, polydipsia, no heat or cold intolerance. No recent change in weight. HEMATOLOGICAL: No anemia or easy bruising or bleeding. NEUROLOGIC: No headache, seizures, numbness, tingling or weakness. PSYCHIATRIC: No depression, no loss of interest in normal activity or change in sleep pattern.     Exam: chaperone present  BP 148/92  Ht 5\' 3"  (1.6 m)  Wt 154 lb (69.854 kg)  BMI 27.29 kg/m2  Body mass index is 27.29 kg/(m^2).  General appearance : Well developed well nourished female. No acute distress HEENT: Neck supple, trachea midline, no carotid bruits, no thyroidmegaly Lungs: Clear to auscultation, no rhonchi or wheezes, or rib retractions  Heart: Regular rate and rhythm, no murmurs or gallops Breast:Examined in sitting  and supine position were symmetrical in appearance, no palpable masses or tenderness,  no skin retraction, no nipple inversion, no nipple discharge, no skin discoloration, no axillary or supraclavicular lymphadenopathy Abdomen: no palpable masses or tenderness, no rebound or guarding Extremities: no edema or skin discoloration or tenderness  Pelvic:  Bartholin, Urethra, Skene Glands: Within normal limits             Vagina: No gross lesions or discharge, vaginal atrophy  Cervix:absent  Uterus Absent  Adnexa  Without  masses or tenderness  Anus and perineum  normal   Rectovaginal  normal sphincter tone without palpated masses or tenderness             Hemoccult card provided     Assessment/Plan:  78 y.o. female with past history of total abdominal hysterectomy with bilateral salpingo-oophorectomy secondary to stage IB grade 1 minimally invasive adenocarcinoma of the uterus and doing well. The patient is on Vivelle-Dot 0.075 mg transdermal patch for which she applies half twice a week which is helped for vasomotor symptoms and vaginal atrophy. She was given a requisition to schedule her mammogram. She was reminded that she needs to contact Dr. Laural Mccormick her gastroenterologist for colonoscopy that is overdue. We have given her Hemoccult card system into the office for testing. We discussed importance of calcium and vitamin D for osteoporosis prevention. She was scheduled bone density study here in our office in the next 2 weeks. Once again the risks benefits and pros and cons of hormone replacement therapy were discussed. Patient fully understands and accepts risks.Pap smear done today    Ok Edwards MD, 2:13 PM 06/25/2012

## 2012-06-25 NOTE — Patient Instructions (Signed)

## 2012-07-01 ENCOUNTER — Other Ambulatory Visit: Payer: Self-pay | Admitting: Anesthesiology

## 2012-07-01 DIAGNOSIS — Z1211 Encounter for screening for malignant neoplasm of colon: Secondary | ICD-10-CM

## 2012-07-10 ENCOUNTER — Encounter: Payer: Self-pay | Admitting: Gynecology

## 2012-07-30 ENCOUNTER — Ambulatory Visit (INDEPENDENT_AMBULATORY_CARE_PROVIDER_SITE_OTHER): Payer: Medicare Other

## 2012-07-30 DIAGNOSIS — Z79890 Hormone replacement therapy (postmenopausal): Secondary | ICD-10-CM

## 2012-07-30 DIAGNOSIS — Z780 Asymptomatic menopausal state: Secondary | ICD-10-CM

## 2012-08-06 ENCOUNTER — Other Ambulatory Visit: Payer: Self-pay | Admitting: Dermatology

## 2012-08-07 ENCOUNTER — Other Ambulatory Visit: Payer: Self-pay

## 2012-10-03 ENCOUNTER — Ambulatory Visit: Payer: Medicare Other | Admitting: Oncology

## 2012-10-03 ENCOUNTER — Other Ambulatory Visit: Payer: Medicare Other | Admitting: Lab

## 2012-10-08 ENCOUNTER — Other Ambulatory Visit: Payer: Self-pay | Admitting: Oncology

## 2012-10-08 DIAGNOSIS — C8591 Non-Hodgkin lymphoma, unspecified, lymph nodes of head, face, and neck: Secondary | ICD-10-CM

## 2012-10-09 ENCOUNTER — Telehealth: Payer: Self-pay | Admitting: Oncology

## 2012-10-09 ENCOUNTER — Ambulatory Visit (HOSPITAL_BASED_OUTPATIENT_CLINIC_OR_DEPARTMENT_OTHER): Payer: Medicare Other | Admitting: Oncology

## 2012-10-09 ENCOUNTER — Other Ambulatory Visit (HOSPITAL_BASED_OUTPATIENT_CLINIC_OR_DEPARTMENT_OTHER): Payer: Medicare Other | Admitting: Lab

## 2012-10-09 VITALS — BP 159/69 | HR 77 | Temp 97.4°F | Resp 20 | Ht 63.0 in | Wt 159.1 lb

## 2012-10-09 DIAGNOSIS — I10 Essential (primary) hypertension: Secondary | ICD-10-CM

## 2012-10-09 DIAGNOSIS — C8581 Other specified types of non-Hodgkin lymphoma, lymph nodes of head, face, and neck: Secondary | ICD-10-CM

## 2012-10-09 DIAGNOSIS — C8591 Non-Hodgkin lymphoma, unspecified, lymph nodes of head, face, and neck: Secondary | ICD-10-CM

## 2012-10-09 LAB — CBC WITH DIFFERENTIAL/PLATELET
Eosinophils Absolute: 0.2 10*3/uL (ref 0.0–0.5)
HCT: 41.2 % (ref 34.8–46.6)
LYMPH%: 46 % (ref 14.0–49.7)
MCV: 91.1 fL (ref 79.5–101.0)
MONO#: 0.6 10*3/uL (ref 0.1–0.9)
MONO%: 6.7 % (ref 0.0–14.0)
NEUT#: 3.6 10*3/uL (ref 1.5–6.5)
NEUT%: 43.9 % (ref 38.4–76.8)
Platelets: 182 10*3/uL (ref 145–400)
WBC: 8.3 10*3/uL (ref 3.9–10.3)

## 2012-10-09 LAB — COMPREHENSIVE METABOLIC PANEL (CC13)
Anion Gap: 8 mEq/L (ref 3–11)
BUN: 15.6 mg/dL (ref 7.0–26.0)
CO2: 29 mEq/L (ref 22–29)
Creatinine: 0.8 mg/dL (ref 0.6–1.1)
Glucose: 111 mg/dl (ref 70–140)
Total Bilirubin: 0.71 mg/dL (ref 0.20–1.20)

## 2012-10-09 NOTE — Telephone Encounter (Signed)
Gave pt appt for lab and Md on october 2015

## 2012-10-09 NOTE — Progress Notes (Signed)
Hematology and Oncology Follow Up Visit  Melissa Mccormick [id number] 1924-05-09 79 y.o. 10/09/2012 12:14 PM    CC: Juanetta Gosling, MD  Dossie Der, MD  Jake Bathe, MD    Principle Diagnosis: This is an 79 year old female with stage 1A low-grade non-Hodgkin's lymphoma, marginal zone subtype diagnosed in 08/2010.  Prior Therapy: The patient is S/P an excisional biopsy that was done on September 02, 2010.  The case number was [id number] which showed a CD20 positive low grade non-Hodgkin's lymphoma and marginal zone subtype that is low grade and as mentioned showed a CD20 positive, CD79 positive without coexpression of CD5 and CD10.   Current therapy: Observation and surveillance.   Interim History:  The patient presents today for follow up for her of lymphoma and clinically feels well. She is not reporting any chest pain.  She is not reporting any difficulty breathing.  Did not report any symptomatic dysphagia.  Did not report any fevers, chills, night sweats or weight loss.  Her energy performance status remains excellent.  She continues to live independently and attends to activities of daily living without any hindrance or decline. She continue to exercise regularly  without any difficulty. She is completely symptom free at this time.  Medications: I have reviewed the patient's current medications.  Current Outpatient Prescriptions  Medication Sig Dispense Refill  . aspirin 81 MG tablet Take 81 mg by mouth daily.        . calcium citrate-vitamin D (CITRACAL+D) 315-200 MG-UNIT per tablet Take 1 tablet by mouth 2 (two) times daily.        . Carboxymethylcellulose Sodium (REFRESH OP) Apply to eye as needed.        . Cholecalciferol (VITAMIN D-3 PO) Take 2,000 Int'l Units by mouth daily.        . diphenhydramine-acetaminophen (TYLENOL PM) 25-500 MG TABS Take 1 tablet by mouth at bedtime.       Marland Kitchen doxycycline (VIBRAMYCIN) 100 MG capsule       . estradiol (VIVELLE-DOT) 0.075 MG/24HR Place 1  patch onto the skin 2 (two) times a week.  8 patch  12  . fluticasone (FLONASE) 50 MCG/ACT nasal spray as needed.       . loratadine (CLARITIN) 10 MG tablet Take 10 mg by mouth daily.       Marland Kitchen losartan-hydrochlorothiazide (HYZAAR) 50-12.5 MG per tablet Take 1 tablet by mouth daily.        . Multiple Vitamin (MULTI-VITAMIN PO) Take by mouth.        . OMEGA-3 KRILL OIL 300 MG CAPS Take 300 mg by mouth daily.        Marland Kitchen OVER THE COUNTER MEDICATION Take by mouth as needed. Emergen C mix when exposed to colds or flu       . psyllium (METAMUCIL) 58.6 % powder Take 1 packet by mouth 3 (three) times daily. 1 tsp with water in the morning      . traMADol (ULTRAM) 50 MG tablet        No current facility-administered medications for this visit.    Allergies:  Allergies  Allergen Reactions  . Codeine   . Tape     Past Medical History, Surgical history, Social history, and Family History were reviewed and updated.  Review of Systems:  Remaining ROS negative.  Physical Exam: Blood pressure 159/69, pulse 77, temperature 97.4 F (36.3 C), temperature source Oral, resp. rate 20, height 5\' 3"  (1.6 m), weight 159 lb 1.6 oz (72.167 kg).  ECOG: 1 General appearance: alert Head: Normocephalic, without obvious abnormality, atraumatic Neck: no adenopathy, no carotid bruit, no JVD, supple, symmetrical, trachea midline and thyroid not enlarged, symmetric, no tenderness/mass/nodules Lymph nodes: Cervical, supraclavicular, and axillary nodes normal. Heart:regular rate and rhythm, S1, S2 normal, no murmur, click, rub or gallop Lung:chest clear, no wheezing, rales, normal symmetric air entry Abdomin: soft, non-tender, without masses or organomegaly EXT:no erythema, induration, or nodules   Lab Results: Lab Results  Component Value Date   WBC 8.3 10/09/2012   HGB 13.9 10/09/2012   HCT 41.2 10/09/2012   MCV 91.1 10/09/2012   PLT 182 10/09/2012     Impression and Plan:   This is a pleasant 79 year old  female with the following diagnosis:   1. Stage 1A low-grade non-Hodgkin's lymphoma, marginal zone subtype. Currently on observation. Her CT scans from 2013 and 2012 showed no disease at this time. No indication for treatment at this time. I continued to educate her about symptoms of advanced lymphoma and indication of treatment for low grade lymphoma. She has non of these issues at this time. Plan for follow up in 12 months and repeat images as neede  2. Hypertension: followed by her PCP.  Eli Hose, MD 10/8/201412:14 PM

## 2012-11-07 ENCOUNTER — Other Ambulatory Visit: Payer: Self-pay

## 2013-07-01 ENCOUNTER — Encounter: Payer: Medicare Other | Admitting: Gynecology

## 2013-07-09 ENCOUNTER — Encounter: Payer: Self-pay | Admitting: Gynecology

## 2013-07-09 ENCOUNTER — Ambulatory Visit (INDEPENDENT_AMBULATORY_CARE_PROVIDER_SITE_OTHER): Payer: Medicare Other | Admitting: Gynecology

## 2013-07-09 ENCOUNTER — Other Ambulatory Visit (HOSPITAL_COMMUNITY)
Admission: RE | Admit: 2013-07-09 | Discharge: 2013-07-09 | Disposition: A | Discharge status: Home / Self Care | Payer: Medicare Other | Source: Ambulatory Visit | Attending: Gynecology | Admitting: Gynecology

## 2013-07-09 VITALS — BP 130/86 | Ht 63.25 in | Wt 158.0 lb

## 2013-07-09 DIAGNOSIS — Z124 Encounter for screening for malignant neoplasm of cervix: Secondary | ICD-10-CM | POA: Insufficient documentation

## 2013-07-09 DIAGNOSIS — Z8739 Personal history of other diseases of the musculoskeletal system and connective tissue: Secondary | ICD-10-CM

## 2013-07-09 DIAGNOSIS — N952 Postmenopausal atrophic vaginitis: Secondary | ICD-10-CM

## 2013-07-09 DIAGNOSIS — Z8542 Personal history of malignant neoplasm of other parts of uterus: Secondary | ICD-10-CM

## 2013-07-09 DIAGNOSIS — Z1272 Encounter for screening for malignant neoplasm of vagina: Secondary | ICD-10-CM

## 2013-07-09 NOTE — Progress Notes (Signed)
Melissa Mccormick 06/27/1924 [id number]   History:    80 y.o.  for GYN exam and followup.Patient was a patient of Dr. Ubaldo Glassing. His records were reviewed and stated the following: Patient is a G3 P2 presented the primary care of Dr. Drema Dallas her primary physician. 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 Patient in 2012 was diagnosis stage I lymphoma which originated at the base of her left side of her neck. Dr. Donne Hazel the general surgeon had done to biopsy. Patient has done well since then. Patient has been on Vivelle-Dot 0.075 milligram which she cuts in half and applied twice a week for many years. Patient had been counseled in the past a low dose hormone replacement therapy in several studies to include the WHI have been presented to the patient. According to the patient her case and then consulted with GYN oncologist and felt no reservations against her using low-dose transdermal estrogen patch. Patient has past history of osteopenia last bone density study was in 2011. Her last colonoscopy was by Dr. Wynetta Emery in 2002. Patient denies any prior history of any abnormal Pap smear. Her last Pap smear. Patient's hemostatic seen Tdap vaccine are up-to-date.  Patient will be making an appointment with her PCP in the next few weeks she stated that recently she had had an episode of nightmare some shakiness and noted that she vomited it was a onetime event.    Past medical history,surgical history, family history and social history were all reviewed and documented in the EPIC chart.  Gynecologic History No LMP recorded. Patient has had a hysterectomy. Contraception: status post hysterectomy Last Pap: 2014. Results were: normal Last mammogram: 2015. Results were: normal  Obstetric History OB History  Gravida Para Term Preterm AB SAB TAB Ectopic Multiple Living  3 2   1 1    2     # Outcome Date  GA Lbr Len/2nd Weight Sex Delivery Anes PTL Lv  3 SAB           2 PAR           1 PAR                ROS: A ROS was performed and pertinent positives and negatives are included in the history.  GENERAL: No fevers or chills. HEENT: No change in vision, no earache, sore throat or sinus congestion. NECK: No pain or stiffness. CARDIOVASCULAR: No chest pain or pressure. No palpitations. PULMONARY: No shortness of breath, cough or wheeze. GASTROINTESTINAL: No abdominal pain, nausea, vomiting or diarrhea, melena or bright red blood per rectum. GENITOURINARY: No urinary frequency, urgency, hesitancy or dysuria. MUSCULOSKELETAL: No joint or muscle pain, no back pain, no recent trauma. DERMATOLOGIC: No rash, no itching, no lesions. ENDOCRINE: No polyuria, polydipsia, no heat or cold intolerance. No recent change in weight. HEMATOLOGICAL: No anemia or easy bruising or bleeding. NEUROLOGIC: No headache, seizures, numbness, tingling or weakness. PSYCHIATRIC: No depression, no loss of interest in normal activity or change in sleep pattern.     Exam: chaperone present  BP 130/86  Ht 5' 3.25" (1.607 m)  Wt 158 lb (71.668 kg)  BMI 27.75 kg/m2  Body mass index is 27.75 kg/(m^2).  General appearance : Well developed well nourished female. No acute distress HEENT: Neck supple, trachea midline, no carotid bruits, no thyroidmegaly Lungs: Clear to auscultation, no rhonchi or wheezes, or rib retractions  Heart:  Regular rate and rhythm, no murmurs or gallops Breast:Examined in sitting and supine position were symmetrical in appearance, no palpable masses or tenderness,  no skin retraction, no nipple inversion, no nipple discharge, no skin discoloration, no axillary or supraclavicular lymphadenopathy Abdomen: no palpable masses or tenderness, no rebound or guarding Extremities: no edema or skin discoloration or tenderness  Pelvic:  Bartholin, Urethra, Skene Glands: Within normal limits             Vagina: No  gross lesions or discharge  Cervix: Absent  Uterus absent  Adnexa  Without masses or tenderness  Anus and perineum  normal   Rectovaginal  normal sphincter tone without palpated masses or tenderness             Hemoccult PCP provides     Assessment/Plan:  80 y.o. female with past history of total abdominal hysterectomy with bilateral salpingo-oophorectomy secondary to stage IB grade 1 minimally invasive adenocarcinoma of the uterus and doing well. The patient is on Vivelle-Dot 0.075 mg transdermal patch for which she applies half twice a week which is helped for vasomotor symptoms and vaginal atrophy.Once again the risks benefits and pros and cons of hormone replacement therapy were discussed. Patient fully understands and accepts risks.Pap smear done today. Patient will make a followup appointment with her PCP discussed above mentioned symptoms.      Note: This dictation was prepared with  Dragon/digital dictation along withSmart phrase technology. Any transcriptional errors that result from this process are unintentional.   Terrance Mass MD, 12:17 PM 07/09/2013

## 2013-07-10 LAB — CYTOLOGY - PAP

## 2013-07-24 ENCOUNTER — Encounter: Payer: Self-pay | Admitting: Gynecology

## 2013-08-06 ENCOUNTER — Other Ambulatory Visit: Payer: Self-pay | Admitting: Gynecology

## 2013-09-10 ENCOUNTER — Other Ambulatory Visit: Payer: Self-pay | Admitting: Dermatology

## 2013-09-17 ENCOUNTER — Other Ambulatory Visit: Payer: Self-pay | Admitting: Oncology

## 2013-09-18 ENCOUNTER — Encounter: Payer: Self-pay | Admitting: *Deleted

## 2013-09-18 ENCOUNTER — Telehealth: Payer: Self-pay | Admitting: Oncology

## 2013-09-18 NOTE — Telephone Encounter (Signed)
per pof to sch pt appt-will call pt w/new appt time & date

## 2013-09-19 ENCOUNTER — Telehealth: Payer: Self-pay | Admitting: Oncology

## 2013-09-19 ENCOUNTER — Ambulatory Visit (HOSPITAL_BASED_OUTPATIENT_CLINIC_OR_DEPARTMENT_OTHER): Payer: Medicare Other | Admitting: Oncology

## 2013-09-19 VITALS — BP 168/68 | HR 102 | Temp 97.7°F | Resp 16 | Wt 151.3 lb

## 2013-09-19 DIAGNOSIS — Z87898 Personal history of other specified conditions: Secondary | ICD-10-CM

## 2013-09-19 DIAGNOSIS — J90 Pleural effusion, not elsewhere classified: Secondary | ICD-10-CM

## 2013-09-19 DIAGNOSIS — R918 Other nonspecific abnormal finding of lung field: Secondary | ICD-10-CM

## 2013-09-19 DIAGNOSIS — R222 Localized swelling, mass and lump, trunk: Secondary | ICD-10-CM

## 2013-09-19 DIAGNOSIS — C8591 Non-Hodgkin lymphoma, unspecified, lymph nodes of head, face, and neck: Secondary | ICD-10-CM

## 2013-09-19 NOTE — Progress Notes (Signed)
Hematology and Oncology Follow Up Visit  Melissa Mccormick [id number] 05/14/1924 80 y.o. 09/19/2013 1:49 PM    CC: [name], MD  [name], MD  [name], MD    Principle Diagnosis: This is an 80 year old female with stage 1A low-grade non-Hodgkin's lymphoma, marginal zone subtype diagnosed in 08/2010. Now has a right-sided pleural-based lung mass.  Prior Therapy: The patient is S/P an excisional biopsy that was done on September 02, 2010.  The case number was [id number] which showed a CD20 positive low grade non-Hodgkin's lymphoma and marginal zone subtype that is low grade and as mentioned showed a CD20 positive, CD79 positive without coexpression of CD5 and CD10.   Current therapy: Observation and surveillance.   Interim History:  The patient presents today for follow up visit with her daughter. Her appointment has been moved earlier after she developed respiratory symptoms including cough and shortness of breath. She reported low-grade fevers and nighttime coughing. She also had generalized weakness. She was evaluated by her primary care physician Dr. Drema Dallas and had a chest x-ray on 09/15/2013 which showed right-sided pleural effusion as well as pleural-based mass. She was started on Z pack and about to finish it now.  She is not reporting any chest pain.  She is not reporting any difficulty breathing. I does report exertional dyspnea.  Did not report any symptomatic dysphagia.  Did not report any fevers, chills, night sweats or weight loss. Her energy performance status remains is improving at this time.  She continues to live independently and attends to activities of daily living without any hindrance or decline. She has not reported any headaches or syncope or seizure activities. She is not reporting any nausea or vomiting. No abdominal pain. The rest of  review of systems unremarkable.  Medications: I have reviewed the patient's current medications.  Current Outpatient  Prescriptions  Medication Sig Dispense Refill  . aspirin 81 MG tablet Take 81 mg by mouth daily.        . calcium citrate-vitamin D (CITRACAL+D) 315-200 MG-UNIT per tablet Take 1 tablet by mouth 2 (two) times daily.        . Carboxymethylcellulose Sodium (REFRESH OP) Apply to eye as needed.        . Cholecalciferol (VITAMIN D-3 PO) Take 2,000 Int'l Units by mouth daily.        . diphenhydramine-acetaminophen (TYLENOL PM) 25-500 MG TABS Take 1 tablet by mouth at bedtime.       Marland Kitchen doxycycline (VIBRAMYCIN) 100 MG capsule       . fluticasone (FLONASE) 50 MCG/ACT nasal spray as needed.       . loratadine (CLARITIN) 10 MG tablet Take 10 mg by mouth daily.       Marland Kitchen losartan-hydrochlorothiazide (HYZAAR) 50-12.5 MG per tablet Take 1 tablet by mouth daily.        . Multiple Vitamin (MULTI-VITAMIN PO) Take by mouth.        . OMEGA-3 KRILL OIL 300 MG CAPS Take 300 mg by mouth daily.        Marland Kitchen OVER THE COUNTER MEDICATION Take by mouth as needed. Emergen C mix when exposed to colds or flu       . psyllium (METAMUCIL) 58.6 % powder Take 1 packet by mouth 3 (three) times daily. 1 tsp with water in the morning      . traMADol (ULTRAM) 50 MG tablet       . VIVELLE-DOT 0.075 MG/24HR PLACE 1 PATCH ONTO THE SKIN  TWICE WEEKLY  8 patch  11   No current facility-administered medications for this visit.    Allergies:  Allergies  Allergen Reactions  . Codeine   . Latex   . Tape     Past Medical History, Surgical history, Social history, and Family History were reviewed and updated.  Review of Systems:  Remaining ROS negative.  Physical Exam: Blood pressure 168/68, pulse 102, temperature 97.7 F (36.5 C), temperature source Oral, resp. rate 16, weight 151 lb 5 oz (68.635 kg). ECOG: 1 General appearance: alert Head: Normocephalic, without obvious abnormality, atraumatic Neck: no adenopathy Lymph nodes: Cervical, supraclavicular, and axillary nodes normal. Heart:regular rate and rhythm, S1, S2 normal, no  murmur, click, rub or gallop Lung:chest clear, no wheezing, rales, normal symmetric air entry Abdomin: soft, non-tender, without masses or organomegaly EXT:no erythema, induration, or nodules   Lab Results:  08/20/2013 CBC showed mild leukocytosis with a normal hemoglobin and hematocrit. Liver function tests and kidney function all normal.  CXR on 09/15/2013  IMPRESSION: Interval development of mild to moderate right pleural effusion with probable underlying atelectasis.  New rounded right upper lobe density is noted concerning for possible mass or neoplasm. CT scan of the chest with contrast is recommended for further evaluation   Impression and Plan:   This is a pleasant 80 year old female with the following diagnosis:   1. Stage 1A low-grade non-Hodgkin's lymphoma, marginal zone subtype. Currently on observation. Her CT scans from 2013 and 2012 showed no disease at this time.  2. Lung mass with pleural effusion: The differential diagnosis discussed with the patient and her daughter today. Benign etiologies such as pneumonia, chronic granulomatous disease are a possibility but we have to rule out malignancy. Primary lung neoplasm is more likely than metastatic disease from a lymphoma or endometrial cancer.  To work this up, we will obtain a PET CT scan to completely identifying this process in her right lung. After a PET CT scan is suspicious, will proceed with a biopsy. This biopsy will be performed under CT guidance or possibly transbronchial.  All questions are answered today their satisfaction.  [facility], MD 9/18/20151:49 PM

## 2013-09-19 NOTE — Telephone Encounter (Signed)
gv and printed appt sched and avs for pt for OCT. °

## 2013-09-24 ENCOUNTER — Encounter (HOSPITAL_COMMUNITY)
Admission: RE | Admit: 2013-09-24 | Discharge: 2013-09-24 | Disposition: A | Discharge status: Home / Self Care | Payer: Medicare Other | Source: Ambulatory Visit | Attending: Oncology | Admitting: Oncology

## 2013-09-24 DIAGNOSIS — R918 Other nonspecific abnormal finding of lung field: Secondary | ICD-10-CM

## 2013-09-24 DIAGNOSIS — R222 Localized swelling, mass and lump, trunk: Secondary | ICD-10-CM | POA: Insufficient documentation

## 2013-09-24 DIAGNOSIS — C8591 Non-Hodgkin lymphoma, unspecified, lymph nodes of head, face, and neck: Secondary | ICD-10-CM

## 2013-09-24 DIAGNOSIS — C8581 Other specified types of non-Hodgkin lymphoma, lymph nodes of head, face, and neck: Secondary | ICD-10-CM | POA: Insufficient documentation

## 2013-09-24 LAB — GLUCOSE, CAPILLARY: Glucose-Capillary: 101 mg/dL — ABNORMAL HIGH (ref 70–99)

## 2013-09-24 MED ORDER — FLUDEOXYGLUCOSE F - 18 (FDG) INJECTION
7.6000 | Freq: Once | INTRAVENOUS | Status: AC | PRN
  Administered 2013-09-24: 7.6 via INTRAVENOUS

## 2013-09-26 ENCOUNTER — Encounter: Payer: Self-pay | Admitting: *Deleted

## 2013-09-29 ENCOUNTER — Encounter: Payer: Self-pay | Admitting: *Deleted

## 2013-09-29 NOTE — Progress Notes (Signed)
Faxed copy of PET scan report to dr Leighton Ruff [phone], per dr Alen Blew

## 2013-10-06 ENCOUNTER — Other Ambulatory Visit: Payer: Self-pay | Admitting: Dermatology

## 2013-10-15 ENCOUNTER — Telehealth: Payer: Self-pay | Admitting: Oncology

## 2013-10-15 ENCOUNTER — Ambulatory Visit (HOSPITAL_BASED_OUTPATIENT_CLINIC_OR_DEPARTMENT_OTHER): Payer: Medicare Other | Admitting: Oncology

## 2013-10-15 ENCOUNTER — Other Ambulatory Visit: Payer: Medicare Other

## 2013-10-15 VITALS — BP 157/66 | HR 90 | Temp 97.3°F | Resp 17 | Ht 63.25 in | Wt 148.5 lb

## 2013-10-15 DIAGNOSIS — C8591 Non-Hodgkin lymphoma, unspecified, lymph nodes of head, face, and neck: Secondary | ICD-10-CM

## 2013-10-15 DIAGNOSIS — R918 Other nonspecific abnormal finding of lung field: Secondary | ICD-10-CM

## 2013-10-15 NOTE — Progress Notes (Signed)
Hematology and Oncology Follow Up Visit  Melissa Mccormick [id number] 03/16/1924 80 y.o. 10/15/2013 10:49 AM    CC: [name], MD  [name], MD  [name], MD    Principle Diagnosis: This is an 80 year old female with stage 1A low-grade non-Hodgkin's lymphoma, marginal zone subtype diagnosed in 08/2010. Now has a right-sided pleural-based lung mass.  Prior Therapy: The patient is S/P an excisional biopsy that was done on September 02, 2010.  The case number was [id number] which showed a CD20 positive low grade non-Hodgkin's lymphoma and marginal zone subtype that is low grade and as mentioned showed a CD20 positive, CD79 positive without coexpression of CD5 and CD10.   Current therapy: Observation and surveillance.   Interim History:  The patient presents today for follow up visit with her daughter. Since the last visit, she completed a course of antibiotic in the form of azithromycin and subsequently Levaquin. She felt much better at this time. She is not reporting any cough or shortness of breath. She is no longer according fatigue or tiredness. She also underwent a PET/CT scan and findings suggestive of an infectious process.  She is not reporting any chest pain.  She is not reporting any difficulty breathing. I does report exertional dyspnea.  Did not report any symptomatic dysphagia.  Did not report any fevers, chills, night sweats or weight loss. Her energy performance status remains is improving at this time.  She continues to live independently and attends to activities of daily living without any hindrance or decline. She has not reported any headaches or syncope or seizure activities. She is not reporting any nausea or vomiting. No abdominal pain. The rest of  review of systems unremarkable.  Medications: I have reviewed the patient's current medications.  Current Outpatient Prescriptions  Medication Sig Dispense Refill  . aspirin 81 MG tablet Take 81 mg by mouth daily.         . calcium citrate-vitamin D (CITRACAL+D) 315-200 MG-UNIT per tablet Take 1 tablet by mouth 2 (two) times daily.        . Carboxymethylcellulose Sodium (REFRESH OP) Apply to eye as needed.        . Cholecalciferol (VITAMIN D-3 PO) Take 2,000 Int'l Units by mouth daily.        . diphenhydramine-acetaminophen (TYLENOL PM) 25-500 MG TABS Take 1 tablet by mouth at bedtime.       Marland Kitchen doxycycline (VIBRAMYCIN) 100 MG capsule       . fluticasone (FLONASE) 50 MCG/ACT nasal spray as needed.       . loratadine (CLARITIN) 10 MG tablet Take 10 mg by mouth daily.       Marland Kitchen losartan-hydrochlorothiazide (HYZAAR) 50-12.5 MG per tablet Take 1 tablet by mouth daily.        . Multiple Vitamin (MULTI-VITAMIN PO) Take by mouth.        . OMEGA-3 KRILL OIL 300 MG CAPS Take 300 mg by mouth daily.        Marland Kitchen OVER THE COUNTER MEDICATION Take by mouth as needed. Emergen C mix when exposed to colds or flu       . psyllium (METAMUCIL) 58.6 % powder Take 1 packet by mouth 3 (three) times daily. 1 tsp with water in the morning      . traMADol (ULTRAM) 50 MG tablet       . VIVELLE-DOT 0.075 MG/24HR PLACE 1 PATCH ONTO THE SKIN TWICE WEEKLY  8 patch  11   No current facility-administered  medications for this visit.    Allergies:  Allergies  Allergen Reactions  . Codeine   . Latex   . Tape     Past Medical History, Surgical history, Social history, and Family History were reviewed and updated.  Review of Systems:  Remaining ROS negative.  Physical Exam: Blood pressure 157/66, pulse 90, temperature 97.3 F (36.3 C), temperature source Oral, resp. rate 17, height 5' 3.25" (1.607 m), weight 148 lb 8 oz (67.359 kg), SpO2 97.00%. ECOG: 1 General appearance: alert Head: Normocephalic, without obvious abnormality, atraumatic Neck: no adenopathy Lymph nodes: Cervical, supraclavicular, and axillary nodes normal. Heart:regular rate and rhythm, S1, S2 normal, no murmur, click, rub or gallop Lung:chest clear, no wheezing,  rales, normal symmetric air entry Abdomin: soft, non-tender, without masses or organomegaly EXT:no erythema, induration, or nodules  EXAM:  NUCLEAR MEDICINE PET SKULL BASE TO THIGH  TECHNIQUE:  7.6 mCi F-18 FDG was injected intravenously. Full-ring PET imaging  was performed from the skull base to thigh after the radiotracer. CT  data was obtained and used for attenuation correction and anatomic  localization.  FASTING BLOOD GLUCOSE: Value: 101 mg/dl  COMPARISON: Clinic note of 09/19/2013. Chest radiograph of  09/16/2013. CTs of 08/28/2011.  FINDINGS:  NECK  No areas of abnormal hypermetabolism.  CHEST  A subcarinal node which measures 1.7 cm and a S.U.V. max of 3.6 on  image 75 of series 4. This is enlarged from 1.3 cm on 08/28/2011.  Corresponding to the plain film abnormality, within the superior  lateral aspect of the right lower lobe, is a pleural-based opacity  which is hypermetabolic. Measures on the order of 3.5 cm and a  S.U.V. max of 8.5.  Minimal pleural based dependent opacity at the left lower lobe which  corresponds to mild hypermetabolism. Measures a S.U.V. max of 2.3 on  image 41 of series 8.  Small right pleural effusion demonstrates nonspecific low-level  hypermetabolism.  ABDOMEN/PELVIS  Mild hypermetabolism within the proximal transverse colon is without  dominant CT correlate. Measures a S.U.V. max of 6.0, including on  approximately image 138.  SKELETON  No abnormal marrow activity.  CT IMAGES PERFORMED FOR ATTENUATION CORRECTION  Surgical changes in the left side of the neck. Carotid  atherosclerosis. No cervical adenopathy.  Subcentimeter left thyroid nodule which is nonspecific.  Atherosclerosis, including within the coronary arteries. Mild  cardiomegaly. Small hiatal hernia. Lower lobe predominant bronchial  wall thickening, chronic  Cholelithiasis. Small hiatal hernia. Pelvic floor laxity.  Hysterectomy.  IMPRESSION:  1. Pleural-based superior  segment right lower lobe opacity with  hypermetabolism. No air bronchograms within. In the acute setting,  infection or infarct could have this appearance. Primary  bronchogenic carcinoma is possible. Presuming the patient has  infectious symptoms, short-term follow-up CT should be considered.  If there no such infectious symptoms, tissue sampling would be  suggested.  2. Nonspecific low-level hypermetabolism about the dependent left  lower lobe. This also could be infectious.  3. New subcarinal adenopathy with mild hypermetabolism. This could  be reactive or metastatic. Given absence of hypermetabolic  adenopathy elsewhere, recurrent lymphoma felt unlikely.  4. Small right pleural effusion with mild low-level hypermetabolism.  No focal hypermetabolism to suggest malignant effusion.  5. Likely physiologic proximal transverse colonic hypermetabolism.  Correlate with symptoms to suggest colonic polyp or mass.  6. Incidental findings, including cholelithiasis and  atherosclerosis.    Impression and Plan:   This is a pleasant 80 year old female with the following diagnosis:   1.  Stage 1A low-grade non-Hodgkin's lymphoma, marginal zone subtype. Currently on observation. Her CT scans from 2013 and 2012 showed no disease at this time.  2. Lung mass with pleural effusion: The PET/CT scan obtained on 09/24/2013 was discussed with the patient and her daughter. I personally reviewed these imaging studies with radiology and discussed the differential diagnosis. The appearance is very suspicious with an infectious process especially in the clinical setting of improvement with antibiotics. Although I cannot rule out malignancy at this time, this is more likely infection than malignancy. To insure that is the case, I will repeat imaging studies in 3 months to make sure that this mass have resolved. Alternatively, we can consider a biopsy at this time which is  an invasive approach and the patient  declined. This was discussed with the patient and her daughter and they are agreeable with the plan. All questions are answered today their satisfaction.  [facility], MD 10/14/201510:49 AM

## 2013-10-15 NOTE — Telephone Encounter (Signed)
gv and printed appt sched and avs for pt for Jan 2016 °

## 2014-01-14 ENCOUNTER — Encounter (HOSPITAL_COMMUNITY): Payer: Self-pay

## 2014-01-14 ENCOUNTER — Ambulatory Visit (HOSPITAL_COMMUNITY)
Admission: RE | Admit: 2014-01-14 | Discharge: 2014-01-14 | Disposition: A | Discharge status: Home / Self Care | Payer: Medicare Other | Source: Ambulatory Visit | Attending: Oncology | Admitting: Oncology

## 2014-01-14 ENCOUNTER — Other Ambulatory Visit (HOSPITAL_BASED_OUTPATIENT_CLINIC_OR_DEPARTMENT_OTHER): Payer: Medicare Other

## 2014-01-14 DIAGNOSIS — C8591 Non-Hodgkin lymphoma, unspecified, lymph nodes of head, face, and neck: Secondary | ICD-10-CM

## 2014-01-14 DIAGNOSIS — R918 Other nonspecific abnormal finding of lung field: Secondary | ICD-10-CM

## 2014-01-14 DIAGNOSIS — C8588 Other specified types of non-Hodgkin lymphoma, lymph nodes of multiple sites: Secondary | ICD-10-CM

## 2014-01-14 DIAGNOSIS — Z8542 Personal history of malignant neoplasm of other parts of uterus: Secondary | ICD-10-CM | POA: Insufficient documentation

## 2014-01-14 LAB — COMPREHENSIVE METABOLIC PANEL (CC13)
ALBUMIN: 3.7 g/dL (ref 3.5–5.0)
ALT: 15 U/L (ref 0–55)
ANION GAP: 8 meq/L (ref 3–11)
AST: 23 U/L (ref 5–34)
Alkaline Phosphatase: 71 U/L (ref 40–150)
BILIRUBIN TOTAL: 0.73 mg/dL (ref 0.20–1.20)
BUN: 13.5 mg/dL (ref 7.0–26.0)
CHLORIDE: 104 meq/L (ref 98–109)
CO2: 29 meq/L (ref 22–29)
Calcium: 9.2 mg/dL (ref 8.4–10.4)
Creatinine: 0.8 mg/dL (ref 0.6–1.1)
EGFR: 64 mL/min/{1.73_m2} — AB (ref >90)
GLUCOSE: 85 mg/dL (ref 70–140)
POTASSIUM: 3.5 meq/L (ref 3.5–5.1)
Sodium: 142 mEq/L (ref 136–145)
TOTAL PROTEIN: 6.6 g/dL (ref 6.4–8.3)

## 2014-01-14 LAB — CBC WITH DIFFERENTIAL/PLATELET
BASO%: 1 % (ref 0.0–2.0)
Basophils Absolute: 0.1 10*3/uL (ref 0.0–0.1)
EOS ABS: 0.2 10*3/uL (ref 0.0–0.5)
EOS%: 2.1 % (ref 0.0–7.0)
HCT: 42.6 % (ref 34.8–46.6)
HGB: 13.9 g/dL (ref 11.6–15.9)
LYMPH%: 49.5 % (ref 14.0–49.7)
MCH: 29.3 pg (ref 25.1–34.0)
MCHC: 32.5 g/dL (ref 31.5–36.0)
MCV: 90 fL (ref 79.5–101.0)
MONO#: 0.5 10*3/uL (ref 0.1–0.9)
MONO%: 6.2 % (ref 0.0–14.0)
NEUT%: 41.2 % (ref 38.4–76.8)
NEUTROS ABS: 3.7 10*3/uL (ref 1.5–6.5)
PLATELETS: 191 10*3/uL (ref 145–400)
RBC: 4.74 10*6/uL (ref 3.70–5.45)
RDW: 14.3 % (ref 11.2–14.5)
WBC: 8.9 10*3/uL (ref 3.9–10.3)
lymph#: 4.4 10*3/uL — ABNORMAL HIGH (ref 0.9–3.3)

## 2014-01-14 MED ORDER — IOHEXOL 300 MG/ML  SOLN
80.0000 mL | Freq: Once | INTRAMUSCULAR | Status: AC | PRN
  Administered 2014-01-14: 80 mL via INTRAVENOUS

## 2014-01-16 ENCOUNTER — Ambulatory Visit (HOSPITAL_BASED_OUTPATIENT_CLINIC_OR_DEPARTMENT_OTHER): Payer: Medicare Other | Admitting: Oncology

## 2014-01-16 ENCOUNTER — Telehealth: Payer: Self-pay | Admitting: Oncology

## 2014-01-16 VITALS — BP 153/86 | HR 85 | Temp 97.8°F | Resp 18 | Ht 63.25 in | Wt 151.8 lb

## 2014-01-16 DIAGNOSIS — Z8579 Personal history of other malignant neoplasms of lymphoid, hematopoietic and related tissues: Secondary | ICD-10-CM

## 2014-01-16 DIAGNOSIS — C8591 Non-Hodgkin lymphoma, unspecified, lymph nodes of head, face, and neck: Secondary | ICD-10-CM

## 2014-01-16 NOTE — Progress Notes (Signed)
Hematology and Oncology Follow Up Visit  Tracie Dore [id number] Jun 08, 1924 81 y.o. 01/16/2014 10:43 AM    CC: [name], MD  [name], MD  [name], MD    Principle Diagnosis: This is an 81 year old female with stage 1A low-grade non-Hodgkin's lymphoma, marginal zone subtype diagnosed in 08/2010.  She developed right-sided pleural-based lung mass likely inflammatory in nature diagnosed in 2015.  Prior Therapy: The patient is S/P an excisional biopsy that was done on September 02, 2010.  The case number was [id number] which showed a CD20 positive low grade non-Hodgkin's lymphoma and marginal zone subtype that is low grade and as mentioned showed a CD20 positive, CD79 positive without coexpression of CD5 and CD10.   Current therapy: Observation and surveillance.   Interim History:  The patient presents today for follow up visit with her daughter. Since the last visit, she doing very well without any further respiratory symptoms. She is no longer taking any antibiotics. She felt much better at this time. She is not reporting any cough or shortness of breath. She is no longer according fatigue or tiredness.  She is not reporting any chest pain.  She is not reporting any difficulty breathing. Did not report any symptomatic dysphagia.  Did not report any fevers, chills, night sweats or weight loss. Her energy performance status remains is improving at this time.  She continues to live independently and attends to activities of daily living without any hindrance or decline. She has not reported any headaches or syncope or seizure activities. She is not reporting any nausea or vomiting. No abdominal pain. The rest of  review of systems unremarkable.  Medications: I have reviewed the patient's current medications.  Current Outpatient Prescriptions  Medication Sig Dispense Refill  . aspirin 81 MG tablet Take 81 mg by mouth daily.      . calcium citrate-vitamin D (CITRACAL+D) 315-200  MG-UNIT per tablet Take 1 tablet by mouth 2 (two) times daily.      . Carboxymethylcellulose Sodium (REFRESH OP) Apply to eye as needed.      . Cholecalciferol (VITAMIN D-3 PO) Take 2,000 Int'l Units by mouth daily.      . diphenhydramine-acetaminophen (TYLENOL PM) 25-500 MG TABS Take 1 tablet by mouth at bedtime.     Marland Kitchen doxycycline (VIBRAMYCIN) 100 MG capsule     . fluticasone (FLONASE) 50 MCG/ACT nasal spray as needed.     . loratadine (CLARITIN) 10 MG tablet Take 10 mg by mouth daily.     Marland Kitchen losartan-hydrochlorothiazide (HYZAAR) 50-12.5 MG per tablet Take 1 tablet by mouth daily.      . Multiple Vitamin (MULTI-VITAMIN PO) Take by mouth.      . OMEGA-3 KRILL OIL 300 MG CAPS Take 300 mg by mouth daily.      Marland Kitchen OVER THE COUNTER MEDICATION Take by mouth as needed. Emergen C mix when exposed to colds or flu     . psyllium (METAMUCIL) 58.6 % powder Take 1 packet by mouth 3 (three) times daily. 1 tsp with water in the morning    . traMADol (ULTRAM) 50 MG tablet     . VIVELLE-DOT 0.075 MG/24HR PLACE 1 PATCH ONTO THE SKIN TWICE WEEKLY 8 patch 11   No current facility-administered medications for this visit.    Allergies:  Allergies  Allergen Reactions  . Codeine   . Latex   . Tape     Past Medical History, Surgical history, Social history, and Family History were reviewed  and updated.  Review of Systems:  Remaining ROS negative.  Physical Exam: Blood pressure 153/86, pulse 85, temperature 97.8 F (36.6 C), temperature source Oral, resp. rate 18, height 5' 3.25" (1.607 m), weight 151 lb 12.8 oz (68.856 kg), SpO2 97 %. ECOG: 1 General appearance: alert awake not in any distress. Head: Normocephalic, without obvious abnormality Neck: no adenopathy Lymph nodes: Cervical, supraclavicular, and axillary nodes normal. Heart:regular rate and rhythm, S1, S2 normal, no murmur, click, rub or gallop Lung:chest clear, no wheezing, rales, normal symmetric air entry Abdomin: soft, non-tender,  without masses or organomegaly EXT:no erythema, induration, or nodules   EXAM: CT CHEST WITH CONTRAST  TECHNIQUE: Multidetector CT imaging of the chest was performed during intravenous contrast administration.  CONTRAST: 38mL OMNIPAQUE IOHEXOL 300 MG/ML SOLN  COMPARISON: PET of 09/24/2013. Chest radiograph of 09/16/2013. Remote CTs of the chest, abdomen, and pelvis of 08/28/2011.  FINDINGS: Mediastinum/Nodes: Aortic and branch vessel atherosclerosis. Mild cardiomegaly. LAD coronary artery atherosclerosis.  Subcarinal adenopathy has resolved. Subcarinal node is upper normal at 1.2 cm on image 29 versus 1.7 cm on the prior. No hilar adenopathy. Small hiatal hernia.  Lungs/Pleura: Nodularity about the non dependent trachea has a similar distribution is on the prior exam. Example on images 11 and 16.  Right lower lobe "Mass" has resolved. Mild interstitial thickening remains in this area on image 32 of series 5. There is mild bronchiectasis involving the right middle lobe and less so right lower lobes. Scattered nodular opacities within these areas. Similar to minimally increased since 08/28/2011  No pleural fluid.  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 This has been present over multiple prior exams. A too small to characterize left lateral segment liver lesion is similar to 2013 and likely a cyst. Normal imaged portions of the spleen, pancreas, adrenal glands, kidneys. Advanced abdominal and branch vessel atherosclerosis.  Musculoskeletal: No acute osseous abnormality.  IMPRESSION: 1. Resolution of the right lower lobe "Mass", consistent with infectious process. 2. Right middle lobe and right lower lobe bronchiectasis and nodularity are grossly similar to the 2013 exam and likely post infectious or inflammatory. 3. Resolution of subcarinal adenopathy, likely reactive. 4. Cardiomegaly.  Atherosclerosis, including within the coronary arteries. 5. 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 6. Mild nodularity about the trachea. This has been similar over prior exams. Cannot exclude tracheal polyps. Given gross stability back to 2013, likely benign or indolent.    Impression and Plan:   This is a pleasant 81 year old female with the following diagnosis:   1. Stage 1A low-grade non-Hodgkin's lymphoma, marginal zone subtype. Currently on observation. Her CT scans and most recently a PET scan on 09/24/2013 did not show any evidence of disease  2. Lung mass with pleural effusion: Repeat imaging studies in 01/14/2014 showed resolution of her right lower lobe mass proving that is indeed infectious process. No further follow-up is needed at this point regarding this issue.  3. Follow-up: Will be in 6 months for a clinical visit and we'll repeat imaging studies as needed.  [facility], MD 1/15/201610:43 AM

## 2014-01-16 NOTE — Telephone Encounter (Signed)
Gave avs & cal for July °

## 2014-07-15 ENCOUNTER — Encounter: Payer: Medicare Other | Admitting: Gynecology

## 2014-07-16 ENCOUNTER — Other Ambulatory Visit: Payer: Self-pay | Admitting: Gynecology

## 2014-07-16 DIAGNOSIS — Z780 Asymptomatic menopausal state: Secondary | ICD-10-CM

## 2014-07-17 ENCOUNTER — Other Ambulatory Visit (HOSPITAL_COMMUNITY)
Admission: RE | Admit: 2014-07-17 | Discharge: 2014-07-17 | Disposition: A | Discharge status: Home / Self Care | Payer: Medicare Other | Source: Ambulatory Visit | Attending: Oncology | Admitting: Oncology

## 2014-07-17 DIAGNOSIS — Z01419 Encounter for gynecological examination (general) (routine) without abnormal findings: Secondary | ICD-10-CM | POA: Diagnosis present

## 2014-07-17 LAB — CBC WITH DIFFERENTIAL/PLATELET
BASOS PCT: 0 % (ref 0–1)
Basophils Absolute: 0 10*3/uL (ref 0.0–0.1)
EOS PCT: 1 % (ref 0–5)
Eosinophils Absolute: 0.1 10*3/uL (ref 0.0–0.7)
HCT: 41.6 % (ref 36.0–46.0)
Hemoglobin: 13.6 g/dL (ref 12.0–15.0)
LYMPHS PCT: 50 % — AB (ref 12–46)
Lymphs Abs: 5.6 10*3/uL — ABNORMAL HIGH (ref 0.7–4.0)
MCH: 30 pg (ref 26.0–34.0)
MCHC: 32.7 g/dL (ref 30.0–36.0)
MCV: 91.6 fL (ref 78.0–100.0)
MONOS PCT: 5 % (ref 3–12)
Monocytes Absolute: 0.6 10*3/uL (ref 0.1–1.0)
NEUTROS ABS: 4.9 10*3/uL (ref 1.7–7.7)
Neutrophils Relative %: 44 % (ref 43–77)
Platelets: 207 10*3/uL (ref 150–400)
RBC: 4.54 MIL/uL (ref 3.87–5.11)
RDW: 13.8 % (ref 11.5–15.5)
WBC: 11.2 10*3/uL — ABNORMAL HIGH (ref 4.0–10.5)

## 2014-07-17 LAB — COMPREHENSIVE METABOLIC PANEL
ALBUMIN: 3.9 g/dL (ref 3.5–5.0)
ALT: 18 U/L (ref 14–54)
AST: 22 U/L (ref 15–41)
Alkaline Phosphatase: 65 U/L (ref 38–126)
Anion gap: 5 (ref 5–15)
BUN: 19 mg/dL (ref 6–20)
CALCIUM: 9.3 mg/dL (ref 8.9–10.3)
CHLORIDE: 106 mmol/L (ref 101–111)
CO2: 30 mmol/L (ref 22–32)
Creatinine, Ser: 0.77 mg/dL (ref 0.44–1.00)
GFR calc Af Amer: >60 mL/min (ref >60)
GFR calc non Af Amer: >60 mL/min (ref >60)
Glucose, Bld: 99 mg/dL (ref 65–99)
POTASSIUM: 4.1 mmol/L (ref 3.5–5.1)
SODIUM: 141 mmol/L (ref 135–145)
TOTAL PROTEIN: 6.8 g/dL (ref 6.5–8.1)
Total Bilirubin: 0.7 mg/dL (ref 0.3–1.2)

## 2014-07-29 ENCOUNTER — Other Ambulatory Visit: Payer: Medicare Other

## 2014-07-29 ENCOUNTER — Telehealth: Payer: Self-pay | Admitting: Oncology

## 2014-07-29 ENCOUNTER — Ambulatory Visit (HOSPITAL_BASED_OUTPATIENT_CLINIC_OR_DEPARTMENT_OTHER): Payer: Medicare Other | Admitting: Oncology

## 2014-07-29 VITALS — BP 162/68 | HR 80 | Temp 97.5°F | Resp 18 | Ht 63.25 in | Wt 152.9 lb

## 2014-07-29 DIAGNOSIS — C8589 Other specified types of non-Hodgkin lymphoma, extranodal and solid organ sites: Secondary | ICD-10-CM

## 2014-07-29 DIAGNOSIS — C8591 Non-Hodgkin lymphoma, unspecified, lymph nodes of head, face, and neck: Secondary | ICD-10-CM

## 2014-07-29 NOTE — Progress Notes (Signed)
Hematology and Oncology Follow Up Visit  Melissa Mccormick [id number] [birth year] 81 y.o. 07/29/2014 1:11 PM    CC: [name], MD  [name], MD  [name], MD    Principle Diagnosis: This is an 81 year old female with stage 1A low-grade non-Hodgkin's lymphoma, marginal zone subtype diagnosed in 08/2010.  She developed right-sided pleural-based lung mass likely inflammatory in nature diagnosed in 2015 which says have resolved.  Prior Therapy: The patient is S/P an excisional biopsy that was done on September 02, 2010.  The case number was [id number] which showed a CD20 positive low grade non-Hodgkin's lymphoma and marginal zone subtype that is low grade and as mentioned showed a CD20 positive, CD79 positive without coexpression of CD5 and CD10.   Current therapy: Observation and surveillance.   Interim History:  The patient presents today for follow up visit. Since the last visit, she reports no complaints. She denies any further respiratory symptoms. She is not reporting any cough or shortness of breath. She has not reported any appetite changes or constitutional symptoms.  She has not reported any lymphadenopathy or masses. She denied any masses in her neck, axilla or groin. She is not reporting any chest pain.  She is not reporting any difficulty breathing. Did not report any symptomatic dysphagia.  Did not report any fevers, chills, night sweats or weight loss. Her energy performance status is normal.  She continues to live independently and attends to activities of daily living without any hindrance or decline. She has not reported any headaches or syncope or seizure activities. She is not reporting any nausea or vomiting. No abdominal pain. The rest of  review of systems unremarkable.  Medications: I have reviewed the patient's current medications.  Current Outpatient Prescriptions  Medication Sig Dispense Refill  . aspirin 81 MG tablet Take 81 mg by mouth daily.      .  calcium citrate-vitamin D (CITRACAL+D) 315-200 MG-UNIT per tablet Take 1 tablet by mouth daily.     . Carboxymethylcellulose Sodium (REFRESH OP) Apply to eye as needed.      . Cholecalciferol (VITAMIN D-3 PO) Take 2,000 Int'l Units by mouth daily.      . diphenhydramine-acetaminophen (TYLENOL PM) 25-500 MG TABS Take 1 tablet by mouth at bedtime.     . fluticasone (FLONASE) 50 MCG/ACT nasal spray as needed.     . loratadine (CLARITIN) 10 MG tablet Take 10 mg by mouth daily.     Marland Kitchen losartan-hydrochlorothiazide (HYZAAR) 50-12.5 MG per tablet Take 1 tablet by mouth daily.      . Multiple Vitamin (MULTI-VITAMIN PO) Take by mouth.      . OMEGA-3 KRILL OIL 300 MG CAPS Take 300 mg by mouth daily.      Marland Kitchen OVER THE COUNTER MEDICATION Take by mouth as needed. Emergen C mix when exposed to colds or flu     . psyllium (METAMUCIL) 58.6 % powder Take 1 packet by mouth 3 (three) times daily. 1 tsp with water in the morning    . traMADol (ULTRAM) 50 MG tablet     . VIVELLE-DOT 0.075 MG/24HR PLACE 1 PATCH ONTO THE SKIN TWICE WEEKLY 8 patch 11   No current facility-administered medications for this visit.    Allergies:  Allergies  Allergen Reactions  . Codeine   . Latex   . Tape     Past Medical History, Surgical history, Social history, and Family History were reviewed and updated.  Review of Systems:  Remaining ROS negative.  Physical Exam: Blood pressure 162/68, pulse 80, temperature 97.5 F (36.4 C), temperature source Oral, resp. rate 18, height 5' 3.25" (1.607 m), weight 152 lb 14.4 oz (69.355 kg), SpO2 97 %. ECOG: 1 General appearance: alert awake woman not in any distress. Head: Normocephalic, without obvious abnormality Neck: no adenopathy Lymph nodes: Cervical, supraclavicular, and axillary nodes normal. Heart:regular rate and rhythm, S1, S2 normal, no murmur, click, rub or gallop Lung:chest clear, no wheezing, rales, normal symmetric air entry Abdomin: soft, non-tender, without masses  or organomegaly EXT:no erythema, induration, or nodules  CBC    Component Value Date/Time   WBC 11.2* 07/17/2014 1220   WBC 8.9 01/14/2014 0952   RBC 4.54 07/17/2014 1220   RBC 4.74 01/14/2014 0952   HGB 13.6 07/17/2014 1220   HGB 13.9 01/14/2014 0952   HCT 41.6 07/17/2014 1220   HCT 42.6 01/14/2014 0952   PLT 207 07/17/2014 1220   PLT 191 01/14/2014 0952   MCV 91.6 07/17/2014 1220   MCV 90.0 01/14/2014 0952   MCH 30.0 07/17/2014 1220   MCH 29.3 01/14/2014 0952   MCHC 32.7 07/17/2014 1220   MCHC 32.5 01/14/2014 0952   RDW 13.8 07/17/2014 1220   RDW 14.3 01/14/2014 0952   LYMPHSABS 5.6* 07/17/2014 1220   LYMPHSABS 4.4* 01/14/2014 0952   MONOABS 0.6 07/17/2014 1220   MONOABS 0.5 01/14/2014 0952   EOSABS 0.1 07/17/2014 1220   EOSABS 0.2 01/14/2014 0952   BASOSABS 0.0 07/17/2014 1220   BASOSABS 0.1 01/14/2014 0952      Chemistry      Component Value Date/Time   NA 141 07/17/2014 1220   NA 142 01/14/2014 0952   K 4.1 07/17/2014 1220   K 3.5 01/14/2014 0952   CL 106 07/17/2014 1220   CL 102 08/28/2011 1421   CO2 30 07/17/2014 1220   CO2 29 01/14/2014 0952   BUN 19 07/17/2014 1220   BUN 13.5 01/14/2014 0952   CREATININE 0.77 07/17/2014 1220   CREATININE 0.8 01/14/2014 0952   CREATININE 0.82 07/12/2010 1001      Component Value Date/Time   CALCIUM 9.3 07/17/2014 1220   CALCIUM 9.2 01/14/2014 0952   ALKPHOS 65 07/17/2014 1220   ALKPHOS 71 01/14/2014 0952   AST 22 07/17/2014 1220   AST 23 01/14/2014 0952   ALT 18 07/17/2014 1220   ALT 15 01/14/2014 0952   BILITOT 0.7 07/17/2014 1220   BILITOT 0.73 01/14/2014 0952       Impression and Plan:   This is a pleasant 81 year old female with the following diagnosis:   1. Stage 1A low-grade non-Hodgkin's lymphoma, marginal zone subtype. Currently on observation. Her CT scans and most recently a PET scan on 09/24/2013 did not show any evidence of disease. The plan is to continue with observation clinically and repeat  imaging studies as needed.  Her physical examination and laboratory testing did not reveal any abnormalities.  2. Lung mass with pleural effusion: Repeat imaging studies in 01/14/2014 showed resolution of her right lower lobe mass proving that is indeed infectious process. No further follow-up is needed at this point regarding this issue. She has no clinical signs or symptoms of any respiratory issues.  3. Follow-up: Will be in 6 months for a clinical visit and we will repeat imaging studies as needed.  Zola Button, MD 7/27/20161:11 PM

## 2014-07-29 NOTE — Telephone Encounter (Signed)
Pt confirmed labs/ov per 07/27 POF, gave pt AVS and Calendar.... KJ °

## 2014-07-31 ENCOUNTER — Encounter: Payer: Self-pay | Admitting: Gynecology

## 2014-07-31 ENCOUNTER — Other Ambulatory Visit (HOSPITAL_COMMUNITY)
Admission: RE | Admit: 2014-07-31 | Discharge: 2014-07-31 | Disposition: A | Discharge status: Home / Self Care | Payer: Medicare Other | Source: Ambulatory Visit | Attending: Gynecology | Admitting: Gynecology

## 2014-07-31 ENCOUNTER — Ambulatory Visit (INDEPENDENT_AMBULATORY_CARE_PROVIDER_SITE_OTHER): Payer: Medicare Other | Admitting: Gynecology

## 2014-07-31 VITALS — BP 136/78 | Ht 63.5 in | Wt 151.0 lb

## 2014-07-31 DIAGNOSIS — Z8579 Personal history of other malignant neoplasms of lymphoid, hematopoietic and related tissues: Secondary | ICD-10-CM

## 2014-07-31 DIAGNOSIS — Z8542 Personal history of malignant neoplasm of other parts of uterus: Secondary | ICD-10-CM | POA: Diagnosis not present

## 2014-07-31 DIAGNOSIS — Z780 Asymptomatic menopausal state: Secondary | ICD-10-CM

## 2014-07-31 DIAGNOSIS — Z01419 Encounter for gynecological examination (general) (routine) without abnormal findings: Secondary | ICD-10-CM | POA: Diagnosis not present

## 2014-07-31 DIAGNOSIS — Z01411 Encounter for gynecological examination (general) (routine) with abnormal findings: Secondary | ICD-10-CM | POA: Diagnosis present

## 2014-07-31 NOTE — Progress Notes (Signed)
Melissa Mccormick 26-Apr-1924 [id number]   History:    81 y.o.  for annual gyn exam with no complaints today. Patient was a prior patient of Dr. Ubaldo Glassing for which we saw for the first time in 2015.His records were reviewed and stated the following: Patient is a G3 P2 presented the primary care of Dr. Drema Dallas her primary physician. 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 Patient in 2012 was diagnosis stage I lymphoma which originated at the base of her left side of her neck. Dr. Donne Hazel the general surgeon had done to biopsy. Patient has done well since then. Patient has been on Vivelle-Dot 0.075 milligram which she cuts in half and applied twice a week for many years. Patient had been counseled in the past a low dose hormone replacement therapy in several studies to include the WHI have been presented to the patient. According to the patient her case and then consulted with GYN oncologist and felt no reservations against her using low-dose transdermal estrogen patch. Patient has past history of osteopenia but her last bone density study in 2015 in our office was normal.Her last colonoscopy was by Dr. Wynetta Emery in 2002. Patient denies any prior history of any abnormal Pap smear.  Past medical history,surgical history, family history and social history were all reviewed and documented in the EPIC chart.  Gynecologic History No LMP recorded. Patient has had a hysterectomy. Contraception: post menopausal status Last Pap: 2015. Results were: normal Last mammogram: Today. Results were: Results pending  Obstetric History OB History  Gravida Para Term Preterm AB SAB TAB Ectopic Multiple Living  3 2   1 1    2     # Outcome Date GA Lbr Len/2nd Weight Sex Delivery Anes PTL Lv  3 SAB           2 Para           1 Para                ROS: A ROS was performed and pertinent positives and negatives are  included in the history.  GENERAL: No fevers or chills. HEENT: No change in vision, no earache, sore throat or sinus congestion. NECK: No pain or stiffness. CARDIOVASCULAR: No chest pain or pressure. No palpitations. PULMONARY: No shortness of breath, cough or wheeze. GASTROINTESTINAL: No abdominal pain, nausea, vomiting or diarrhea, melena or bright red blood per rectum. GENITOURINARY: No urinary frequency, urgency, hesitancy or dysuria. MUSCULOSKELETAL: No joint or muscle pain, no back pain, no recent trauma. DERMATOLOGIC: No rash, no itching, no lesions. ENDOCRINE: No polyuria, polydipsia, no heat or cold intolerance. No recent change in weight. HEMATOLOGICAL: No anemia or easy bruising or bleeding. NEUROLOGIC: No headache, seizures, numbness, tingling or weakness. PSYCHIATRIC: No depression, no loss of interest in normal activity or change in sleep pattern.     Exam: chaperone present  BP 136/78 mmHg  Ht 5' 3.5" (1.613 m)  Wt 151 lb (68.493 kg)  BMI 26.33 kg/m2  Body mass index is 26.33 kg/(m^2).  General appearance : Well developed well nourished female. No acute distress HEENT: Eyes: no retinal hemorrhage or exudates,  Neck supple, trachea midline, no carotid bruits, no thyroidmegaly Lungs: Clear to auscultation, no rhonchi or wheezes, or rib retractions  Heart: Regular rate and rhythm, no murmurs or gallops Breast:Examined in sitting and supine position were symmetrical in appearance, no palpable masses or tenderness,  no skin retraction, no nipple inversion, no nipple discharge, no skin discoloration, no axillary or supraclavicular lymphadenopathy Abdomen: no palpable masses or tenderness, no rebound or guarding Extremities: no edema or skin discoloration or tenderness  Pelvic:  Bartholin, Urethra, Skene Glands: Within normal limits             Vagina: No gross lesions or discharge, vaginal atrophy  Cervix: Absent  Uterus  absent  Adnexa  Without masses or tenderness  Anus and  perineum  normal   Rectovaginal  normal sphincter tone without palpated masses or tenderness             Hemoccult PCP provides     Assessment/Plan:  81 y.o. female for annual exam with past history of total abdominal hysterectomy with bilateral salpingo-oophorectomy secondary to stage IB grade 1 minimally invasive adenocarcinoma of the uterus and doing well. The patient is on Vivelle-Dot 0.075 mg transdermal patch for which she applies half twice a week which is helped for vasomotor symptoms and vaginal atrophy.Once again the risks benefits and pros and cons of hormone replacement therapy were discussed. Patient fully understands and accepts risks.Pap smear done today. Patient continues to follow-up with her medical oncologist due to her past history of lymphoma. Her PCP has been doing her blood work. Patient scheduled for bone density study here in her office next week.   Terrance Mass MD, 2:58 PM 07/31/2014

## 2014-07-31 NOTE — Patient Instructions (Signed)

## 2014-08-03 ENCOUNTER — Encounter: Payer: Self-pay | Admitting: Gynecology

## 2014-08-04 ENCOUNTER — Other Ambulatory Visit: Payer: Self-pay | Admitting: Gynecology

## 2014-08-04 ENCOUNTER — Ambulatory Visit (INDEPENDENT_AMBULATORY_CARE_PROVIDER_SITE_OTHER): Payer: Medicare Other

## 2014-08-04 DIAGNOSIS — M8580 Other specified disorders of bone density and structure, unspecified site: Secondary | ICD-10-CM

## 2014-08-04 DIAGNOSIS — Z780 Asymptomatic menopausal state: Secondary | ICD-10-CM

## 2014-08-04 LAB — CYTOLOGY - PAP

## 2014-09-16 ENCOUNTER — Other Ambulatory Visit: Payer: Self-pay | Admitting: Gynecology

## 2015-02-03 ENCOUNTER — Other Ambulatory Visit (HOSPITAL_BASED_OUTPATIENT_CLINIC_OR_DEPARTMENT_OTHER): Payer: Medicare Other

## 2015-02-03 ENCOUNTER — Telehealth: Payer: Self-pay | Admitting: Oncology

## 2015-02-03 ENCOUNTER — Ambulatory Visit (HOSPITAL_BASED_OUTPATIENT_CLINIC_OR_DEPARTMENT_OTHER): Payer: Medicare Other | Admitting: Oncology

## 2015-02-03 VITALS — BP 173/61 | HR 86 | Temp 98.0°F | Resp 17 | Ht 63.5 in | Wt 148.9 lb

## 2015-02-03 DIAGNOSIS — Z8572 Personal history of non-Hodgkin lymphomas: Secondary | ICD-10-CM

## 2015-02-03 DIAGNOSIS — C8591 Non-Hodgkin lymphoma, unspecified, lymph nodes of head, face, and neck: Secondary | ICD-10-CM

## 2015-02-03 DIAGNOSIS — D72829 Elevated white blood cell count, unspecified: Secondary | ICD-10-CM | POA: Diagnosis present

## 2015-02-03 LAB — CBC WITH DIFFERENTIAL/PLATELET
BASO%: 0.5 % (ref 0.0–2.0)
Basophils Absolute: 0.1 10*3/uL (ref 0.0–0.1)
EOS ABS: 0.1 10*3/uL (ref 0.0–0.5)
EOS%: 1.2 % (ref 0.0–7.0)
HCT: 37.1 % (ref 34.8–46.6)
HEMOGLOBIN: 12.2 g/dL (ref 11.6–15.9)
LYMPH%: 30.1 % (ref 14.0–49.7)
MCH: 28.4 pg (ref 25.1–34.0)
MCHC: 32.8 g/dL (ref 31.5–36.0)
MCV: 86.5 fL (ref 79.5–101.0)
MONO#: 1 10*3/uL — ABNORMAL HIGH (ref 0.1–0.9)
MONO%: 7.8 % (ref 0.0–14.0)
NEUT%: 60.4 % (ref 38.4–76.8)
NEUTROS ABS: 7.7 10*3/uL — AB (ref 1.5–6.5)
Platelets: 253 10*3/uL (ref 145–400)
RBC: 4.29 10*6/uL (ref 3.70–5.45)
RDW: 13.9 % (ref 11.2–14.5)
WBC: 12.8 10*3/uL — AB (ref 3.9–10.3)
lymph#: 3.9 10*3/uL — ABNORMAL HIGH (ref 0.9–3.3)

## 2015-02-03 LAB — COMPREHENSIVE METABOLIC PANEL
ALT: 21 U/L (ref 0–55)
AST: 25 U/L (ref 5–34)
Albumin: 3.3 g/dL — ABNORMAL LOW (ref 3.5–5.0)
Alkaline Phosphatase: 124 U/L (ref 40–150)
Anion Gap: 10 mEq/L (ref 3–11)
BILIRUBIN TOTAL: 0.67 mg/dL (ref 0.20–1.20)
BUN: 15.4 mg/dL (ref 7.0–26.0)
CO2: 27 mEq/L (ref 22–29)
Calcium: 9.3 mg/dL (ref 8.4–10.4)
Chloride: 103 mEq/L (ref 98–109)
Creatinine: 0.8 mg/dL (ref 0.6–1.1)
EGFR: 65 mL/min/{1.73_m2} — ABNORMAL LOW (ref >90)
GLUCOSE: 102 mg/dL (ref 70–140)
Potassium: 4.2 mEq/L (ref 3.5–5.1)
SODIUM: 139 meq/L (ref 136–145)
TOTAL PROTEIN: 6.5 g/dL (ref 6.4–8.3)

## 2015-02-03 NOTE — Progress Notes (Signed)
Hematology and Oncology Follow Up Visit  Melissa Mccormick [id number] 08-25-1924 82 y.o. 02/03/2015 1:20 PM    CC: [name], MD  [name], MD  [name], MD    Principle Diagnosis: This is an 82 year old female with stage 1A low-grade non-Hodgkin's lymphoma, marginal zone subtype diagnosed in 08/2010.  She developed right-sided pleural-based lung mass likely inflammatory in nature diagnosed in 2015 which says have resolved.  Prior Therapy: The patient is S/P an excisional biopsy that was done on September 02, 2010.  The case number was [id number] which showed a CD20 positive low grade non-Hodgkin's lymphoma and marginal zone subtype that is low grade and as mentioned showed a CD20 positive, CD79 positive without coexpression of CD5 and CD10.   Current therapy: Observation and surveillance.   Interim History: Melissa Mccormick presents today for follow up visit. Since the last visit, she continues to do very well without any recent complaints. She does report some sinus congestion and post nasal drip associated with occasional cough. She denied any hemoptysis or shortness of breath. She has not reported any appetite changes or constitutional symptoms.  She has not reported any lymphadenopathy or masses. She denied any masses in her neck, axilla or groin.   She continues to have reasonable performance status and activity level. She denied any recent hospitalization or illnesses. He does report occasional stress incontinence which has been chronic in nature.   She does not report any headaches, blurry vision, syncope or seizures. She is not reporting any chest pain.  She is not reporting any difficulty breathing. Did not report any symptomatic dysphagia.  Did not report any fevers, chills, night sweats or weight loss. She is not reporting any nausea or vomiting. No abdominal pain. The rest of  review of systems unremarkable.  Medications: I have reviewed the patient's current medications.   Current Outpatient Prescriptions  Medication Sig Dispense Refill  . aspirin 81 MG tablet Take 81 mg by mouth daily.      . calcium citrate-vitamin D (CITRACAL+D) 315-200 MG-UNIT per tablet Take 1 tablet by mouth daily.     . Carboxymethylcellulose Sodium (REFRESH OP) Apply to eye as needed.      . Cholecalciferol (VITAMIN D-3 PO) Take 2,000 Int'l Units by mouth daily.      . diphenhydramine-acetaminophen (TYLENOL PM) 25-500 MG TABS Take 1 tablet by mouth at bedtime.     . fluticasone (FLONASE) 50 MCG/ACT nasal spray as needed.     . loratadine (CLARITIN) 10 MG tablet Take 10 mg by mouth daily.     Marland Kitchen losartan-hydrochlorothiazide (HYZAAR) 50-12.5 MG per tablet Take 1 tablet by mouth daily.      . Multiple Vitamin (MULTI-VITAMIN PO) Take by mouth.      . OMEGA-3 KRILL OIL 300 MG CAPS Take 300 mg by mouth daily.      Marland Kitchen OVER THE COUNTER MEDICATION Take by mouth as needed. Emergen C mix when exposed to colds or flu     . psyllium (METAMUCIL) 58.6 % powder Take 1 packet by mouth 3 (three) times daily. 1 tsp with water in the morning    . traMADol (ULTRAM) 50 MG tablet     . VIVELLE-DOT 0.075 MG/24HR PLACE 1 PATCH ONTO THE SKIN TWICE WEEKLY 8 patch 9   No current facility-administered medications for this visit.    Allergies:  Allergies  Allergen Reactions  . Codeine   . Latex   . Tape     Past Medical  History, Surgical history, Social history, and Family History were reviewed and updated.  Review of Systems:  Remaining ROS negative.  Physical Exam: Blood pressure 173/61, pulse 86, temperature 98 F (36.7 C), temperature source Oral, resp. rate 17, height 5' 3.5" (1.613 m), weight 148 lb 14.4 oz (67.541 kg), SpO2 98 %. ECOG: 1 General appearance: alert awake without distress. Head: Normocephalic, without obvious abnormality Neck: no adenopathy or thyroid masses. Lymph nodes: Cervical, supraclavicular, and axillary nodes normal. Heart:regular rate and rhythm, S1, S2 normal, no  murmur, click, rub or gallop Lung:chest clear, no wheezing, rales, normal symmetric air entry Abdomin: soft, non-tender, without masses or organomegaly EXT:no erythema, induration, or nodules  CBC    Component Value Date/Time   WBC 12.8* 02/03/2015 1245   WBC 11.2* 07/17/2014 1220   RBC 4.29 02/03/2015 1245   RBC 4.54 07/17/2014 1220   HGB 12.2 02/03/2015 1245   HGB 13.6 07/17/2014 1220   HCT 37.1 02/03/2015 1245   HCT 41.6 07/17/2014 1220   PLT 253 02/03/2015 1245   PLT 207 07/17/2014 1220   MCV 86.5 02/03/2015 1245   MCV 91.6 07/17/2014 1220   MCH 28.4 02/03/2015 1245   MCH 30.0 07/17/2014 1220   MCHC 32.8 02/03/2015 1245   MCHC 32.7 07/17/2014 1220   RDW 13.9 02/03/2015 1245   RDW 13.8 07/17/2014 1220   LYMPHSABS 3.9* 02/03/2015 1245   LYMPHSABS 5.6* 07/17/2014 1220   MONOABS 1.0* 02/03/2015 1245   MONOABS 0.6 07/17/2014 1220   EOSABS 0.1 02/03/2015 1245   EOSABS 0.1 07/17/2014 1220   BASOSABS 0.1 02/03/2015 1245   BASOSABS 0.0 07/17/2014 1220      Chemistry      Component Value Date/Time   NA 141 07/17/2014 1220   NA 142 01/14/2014 0952   K 4.1 07/17/2014 1220   K 3.5 01/14/2014 0952   CL 106 07/17/2014 1220   CL 102 08/28/2011 1421   CO2 30 07/17/2014 1220   CO2 29 01/14/2014 0952   BUN 19 07/17/2014 1220   BUN 13.5 01/14/2014 0952   CREATININE 0.77 07/17/2014 1220   CREATININE 0.8 01/14/2014 0952   CREATININE 0.82 07/12/2010 1001      Component Value Date/Time   CALCIUM 9.3 07/17/2014 1220   CALCIUM 9.2 01/14/2014 0952   ALKPHOS 65 07/17/2014 1220   ALKPHOS 71 01/14/2014 0952   AST 22 07/17/2014 1220   AST 23 01/14/2014 0952   ALT 18 07/17/2014 1220   ALT 15 01/14/2014 0952   BILITOT 0.7 07/17/2014 1220   BILITOT 0.73 01/14/2014 0952       Impression and Plan:   This is a pleasant 82 year old female with the following diagnosis:   1. Stage 1A low-grade non-Hodgkin's lymphoma, marginal zone subtype. He is status post surgical resection 2012  and have been on observation and surveillance since that time without any evidence of recurrence.   Her CT scans and PET scan on 09/24/2013 did not show any evidence of disease. The plan is to continue with observation clinically and repeat imaging studies as needed.  Her physical examination and laboratory testing today did not show any evidence of abnormalities.  2. Lung mass with pleural effusion: Repeat imaging studies in 01/14/2014 showed resolution of her right lower lobe mass proving that is indeed infectious process. No further follow-up is needed at this point regarding this issue. She has no respiratory symptoms at this time.  3. Leukocytosis without lymphocytosis: Likely reactive in nature but we will continue to  monitor it closely make sure it is not a sign of a hematological disorder.  3. Follow-up: Will be in 6 months for a clinical visit and we will repeat imaging studies as needed.  Zola Button, MD 2/1/20171:20 PM

## 2015-02-03 NOTE — Telephone Encounter (Signed)
per pof to sch pt appt-gave pt copy of avs °

## 2015-05-11 ENCOUNTER — Encounter: Payer: Self-pay | Admitting: *Deleted

## 2015-05-11 ENCOUNTER — Telehealth: Payer: Self-pay | Admitting: *Deleted

## 2015-05-11 NOTE — Telephone Encounter (Signed)
[place], [place], CCM. CN:  TCF-PATIENT wanting to review package of important paper in of hospitalization.  Documents of current medications and contacts reviewed.  Packet is kept in an area where the EMS personnel can find if needed.  List of personal contact as to whom to call first is also in the packet.  Name and numbers of her pcp and specialist are also documented./Urged the caller to keep this packet up to date with any changes in medications or doctor as needed.  Call was ended.

## 2015-08-03 ENCOUNTER — Other Ambulatory Visit: Payer: Medicare Other

## 2015-08-03 ENCOUNTER — Ambulatory Visit: Payer: Medicare Other | Admitting: Oncology

## 2015-08-04 ENCOUNTER — Telehealth: Payer: Self-pay | Admitting: Oncology

## 2015-08-04 ENCOUNTER — Ambulatory Visit (HOSPITAL_BASED_OUTPATIENT_CLINIC_OR_DEPARTMENT_OTHER): Payer: Medicare Other | Admitting: Oncology

## 2015-08-04 ENCOUNTER — Other Ambulatory Visit (HOSPITAL_BASED_OUTPATIENT_CLINIC_OR_DEPARTMENT_OTHER): Payer: Medicare Other

## 2015-08-04 VITALS — BP 147/60 | HR 102 | Resp 17 | Ht 63.5 in | Wt 139.9 lb

## 2015-08-04 DIAGNOSIS — D72829 Elevated white blood cell count, unspecified: Secondary | ICD-10-CM

## 2015-08-04 DIAGNOSIS — C8591 Non-Hodgkin lymphoma, unspecified, lymph nodes of head, face, and neck: Secondary | ICD-10-CM

## 2015-08-04 DIAGNOSIS — R918 Other nonspecific abnormal finding of lung field: Secondary | ICD-10-CM

## 2015-08-04 DIAGNOSIS — J90 Pleural effusion, not elsewhere classified: Secondary | ICD-10-CM

## 2015-08-04 DIAGNOSIS — R634 Abnormal weight loss: Secondary | ICD-10-CM | POA: Diagnosis not present

## 2015-08-04 DIAGNOSIS — Z8542 Personal history of malignant neoplasm of other parts of uterus: Secondary | ICD-10-CM

## 2015-08-04 DIAGNOSIS — Z8572 Personal history of non-Hodgkin lymphomas: Secondary | ICD-10-CM

## 2015-08-04 LAB — CBC WITH DIFFERENTIAL/PLATELET
BASO%: 0.3 % (ref 0.0–2.0)
Basophils Absolute: 0.1 10*3/uL (ref 0.0–0.1)
EOS%: 0.7 % (ref 0.0–7.0)
Eosinophils Absolute: 0.1 10*3/uL (ref 0.0–0.5)
HCT: 35.5 % (ref 34.8–46.6)
HGB: 11.8 g/dL (ref 11.6–15.9)
LYMPH%: 21.9 % (ref 14.0–49.7)
MCH: 28.7 pg (ref 25.1–34.0)
MCHC: 33.2 g/dL (ref 31.5–36.0)
MCV: 86.4 fL (ref 79.5–101.0)
MONO#: 1.2 10*3/uL — ABNORMAL HIGH (ref 0.1–0.9)
MONO%: 8.2 % (ref 0.0–14.0)
NEUT#: 10.4 10*3/uL — ABNORMAL HIGH (ref 1.5–6.5)
NEUT%: 68.9 % (ref 38.4–76.8)
Platelets: 333 10*3/uL (ref 145–400)
RBC: 4.11 10*6/uL (ref 3.70–5.45)
RDW: 14.9 % — ABNORMAL HIGH (ref 11.2–14.5)
WBC: 15.1 10*3/uL — ABNORMAL HIGH (ref 3.9–10.3)
lymph#: 3.3 10*3/uL (ref 0.9–3.3)

## 2015-08-04 LAB — COMPREHENSIVE METABOLIC PANEL
ALT: 81 U/L — ABNORMAL HIGH (ref 0–55)
AST: 120 U/L — ABNORMAL HIGH (ref 5–34)
Albumin: 2.8 g/dL — ABNORMAL LOW (ref 3.5–5.0)
Alkaline Phosphatase: 663 U/L — ABNORMAL HIGH (ref 40–150)
Anion Gap: 12 mEq/L — ABNORMAL HIGH (ref 3–11)
BUN: 18.2 mg/dL (ref 7.0–26.0)
CO2: 28 mEq/L (ref 22–29)
Calcium: 10.6 mg/dL — ABNORMAL HIGH (ref 8.4–10.4)
Chloride: 95 mEq/L — ABNORMAL LOW (ref 98–109)
Creatinine: 1 mg/dL (ref 0.6–1.1)
EGFR: 48 mL/min/{1.73_m2} — ABNORMAL LOW (ref >90)
Glucose: 128 mg/dl (ref 70–140)
Potassium: 3.3 mEq/L — ABNORMAL LOW (ref 3.5–5.1)
Sodium: 136 mEq/L (ref 136–145)
Total Bilirubin: 1.24 mg/dL — ABNORMAL HIGH (ref 0.20–1.20)
Total Protein: 6.3 g/dL — ABNORMAL LOW (ref 6.4–8.3)

## 2015-08-04 NOTE — Progress Notes (Addendum)
Hematology and Oncology Follow Up Visit  Melissa Mccormick [id number] [birth year] 82 y.o. 08/04/2015 10:39 AM    CC: Melissa Gave, MD  Melissa Hitt, MD  Melissa Pain, MD    Principle Diagnosis: This is an 82 year old female with stage 1A low-grade non-Hodgkin's lymphoma, marginal zone subtype diagnosed in 08/2010.  She developed right-sided pleural-based lung mass likely inflammatory in nature diagnosed in 2015 which says have resolved.  Prior Therapy: The patient is S/P an excisional biopsy that was done on September 02, 2010.  The case number was [id number] which showed a CD20 positive low grade non-Hodgkin's lymphoma and marginal zone subtype that is low grade and as mentioned showed a CD20 positive, CD79 positive without coexpression of CD5 and CD10.   Current therapy: Observation and surveillance.   Interim History: Melissa Mccormick presents today for follow up visit. Since the last visit, she reports few complaints of vertigo and dizziness. She does report some sinus congestion and post nasal drip which has been intermittent and chronic in nature. She denied any hemoptysis or shortness of breath. She has not reported any appetite changes or constitutional symptoms.  She did report losing weight predominantly related to her getting to eat. She has not reported any lymphadenopathy or masses. She denied any masses in her neck, axilla or groin.   She continues to have reasonable performance status and activity level. She denied any recent hospitalization or illnesses.   She does not report any headaches, blurry vision, syncope or seizures. She is not reporting any chest Mccormick.  She is not reporting any difficulty breathing. Did not report any symptomatic dysphagia.  Did not report any fevers, chills, night sweats or weight loss. She is not reporting any nausea or vomiting. No abdominal Mccormick. The rest of  review of systems unremarkable.  Medications: I have reviewed the patient's current  medications.  Current Outpatient Prescriptions  Medication Sig Dispense Refill  . aspirin 81 MG tablet Take 81 mg by mouth daily.      . calcium citrate-vitamin D (CITRACAL+D) 315-200 MG-UNIT per tablet Take 1 tablet by mouth daily.     . Carboxymethylcellulose Sodium (REFRESH OP) Apply to eye as needed.      . Cholecalciferol (VITAMIN D-3 PO) Take 2,000 Int'l Units by mouth daily.      . diphenhydramine-acetaminophen (TYLENOL PM) 25-500 MG TABS Take 1 tablet by mouth at bedtime.     . fluticasone (FLONASE) 50 MCG/ACT nasal spray as needed.     . loratadine (CLARITIN) 10 MG tablet Take 10 mg by mouth daily.     Marland Kitchen losartan-hydrochlorothiazide (HYZAAR) 50-12.5 MG per tablet Take 1 tablet by mouth daily.      . Multiple Vitamin (MULTI-VITAMIN PO) Take by mouth.      . NON FORMULARY Take 1 tablet by mouth daily.    . OMEGA-3 KRILL OIL 300 MG CAPS Take 300 mg by mouth daily.      Marland Kitchen OVER THE COUNTER MEDICATION Take by mouth as needed. Emergen C mix when exposed to colds or flu     . psyllium (METAMUCIL) 58.6 % powder Take 1 packet by mouth 3 (three) times daily. 1 tsp with water in the morning    . VIVELLE-DOT 0.075 MG/24HR PLACE 1 PATCH ONTO THE SKIN TWICE WEEKLY 8 patch 9   No current facility-administered medications for this visit.     Allergies:  Allergies  Allergen Reactions  . Codeine   . Latex   . Tape  Past Medical History, Surgical history, Social history, and Family History were reviewed and updated.  Review of Systems:  Remaining ROS negative.  Physical Exam: Blood pressure (!) 147/60, pulse (!) 102, resp. rate 17, height 5' 3.5" (1.613 m), weight 139 lb 14.4 oz (63.5 kg), SpO2 97 %. ECOG: 1 General appearance: Frail appearing woman without distress. Head: Normocephalic, without obvious abnormality no oral ulcers or lesions. Neck: no adenopathy or thyroid masses. Lymph nodes: Cervical, supraclavicular, and axillary nodes normal. Heart:regular rate and rhythm, S1, S2  normal, no murmur, click, rub or gallop Lung:chest clear, no wheezing, rales, normal symmetric air entry Abdomin: soft, non-tender, without masses or organomegaly no shifting dullness or ascites. EXT:no erythema, induration, or nodules  CBC    Component Value Date/Time   WBC 15.1 (H) 08/04/2015 1007   WBC 11.2 (H) 07/17/2014 1220   RBC 4.11 08/04/2015 1007   RBC 4.54 07/17/2014 1220   HGB 11.8 08/04/2015 1007   HCT 35.5 08/04/2015 1007   PLT 333 08/04/2015 1007   MCV 86.4 08/04/2015 1007   MCH 28.7 08/04/2015 1007   MCH 30.0 07/17/2014 1220   MCHC 33.2 08/04/2015 1007   MCHC 32.7 07/17/2014 1220   RDW 14.9 (H) 08/04/2015 1007   LYMPHSABS 3.3 08/04/2015 1007   MONOABS 1.2 (H) 08/04/2015 1007   EOSABS 0.1 08/04/2015 1007   BASOSABS 0.1 08/04/2015 1007      Chemistry      Component Value Date/Time   NA 139 02/03/2015 1245   K 4.2 02/03/2015 1245   CL 106 07/17/2014 1220   CL 102 08/28/2011 1421   CO2 27 02/03/2015 1245   BUN 15.4 02/03/2015 1245   CREATININE 0.8 02/03/2015 1245      Component Value Date/Time   CALCIUM 9.3 02/03/2015 1245   ALKPHOS 124 02/03/2015 1245   AST 25 02/03/2015 1245   ALT 21 02/03/2015 1245   BILITOT 0.67 02/03/2015 1245       Impression and Plan:   This is a pleasant 82 year old female with the following diagnosis:   1. Stage 1A low-grade non-Hodgkin's lymphoma, marginal zone subtype. He is status post surgical resection 2012 and have been on observation and surveillance since that time without any evidence of recurrence.   Her CT scans and PET scan on 09/24/2013 did not show any evidence of disease. The plan is to continue with observation clinically and repeat imaging studies as needed.  Her physical examination and laboratory testing today did not show any evidence to suggest recurrent disease. Her weight loss is worrisome but likely related to memory issues rather than lymphoma recurrence.  2. Lung mass with pleural effusion:  Repeat imaging studies in 01/14/2014 showed resolution of her right lower lobe mass proving that is indeed infectious process. No further follow-up is needed at this point regarding this issue. She has no respiratory symptoms at this time.  3. Leukocytosis without lymphocytosis: Appear to be reactive although lymphoproliferative process can also be contributing. Plan is to continue monitor this moving forward.  4. Weight loss: We have discussed different strategies and attempt to boost her by mouth intake including scheduling meals and use nutritional supplements. Repeat imaging studies will be indicated if this continues.  5. Follow-up: Will be in 6 months for a clinical visit and we will repeat imaging studies as needed.  Odies Desa, MD 8/2/201710:39 AM    Addendum  Laboratory testing after the patient left revealed increase in her AST, ALT and alkaline phosphatase. She has mild elevation  in her bilirubin which is of unclear etiology. All these findings could be suspicious for malignancy versus other causes. I recommended a CT scan of the chest abdomen and pelvis for staging purposes given these abnormalities. These findings were communicated to her daughter Thayer Headings. We will set up to CT scan in the near future and I will communicate these results as soon as they're available.

## 2015-08-04 NOTE — Telephone Encounter (Signed)
Gave pt cal & avs °

## 2015-08-04 NOTE — Addendum Note (Signed)
Addended by: Wyatt Portela on: 08/04/2015 11:36 AM   Modules accepted: Orders

## 2015-08-10 ENCOUNTER — Ambulatory Visit (HOSPITAL_COMMUNITY)
Admission: RE | Admit: 2015-08-10 | Discharge: 2015-08-10 | Disposition: A | Discharge status: Home / Self Care | Payer: Medicare Other | Source: Ambulatory Visit | Attending: Oncology | Admitting: Oncology

## 2015-08-10 DIAGNOSIS — I700 Atherosclerosis of aorta: Secondary | ICD-10-CM | POA: Insufficient documentation

## 2015-08-10 DIAGNOSIS — J479 Bronchiectasis, uncomplicated: Secondary | ICD-10-CM | POA: Insufficient documentation

## 2015-08-10 DIAGNOSIS — Q266 Portal vein-hepatic artery fistula: Secondary | ICD-10-CM | POA: Insufficient documentation

## 2015-08-10 DIAGNOSIS — C787 Secondary malignant neoplasm of liver and intrahepatic bile duct: Secondary | ICD-10-CM | POA: Insufficient documentation

## 2015-08-10 DIAGNOSIS — R933 Abnormal findings on diagnostic imaging of other parts of digestive tract: Secondary | ICD-10-CM | POA: Diagnosis not present

## 2015-08-10 DIAGNOSIS — Z8542 Personal history of malignant neoplasm of other parts of uterus: Secondary | ICD-10-CM

## 2015-08-10 DIAGNOSIS — I2510 Atherosclerotic heart disease of native coronary artery without angina pectoris: Secondary | ICD-10-CM | POA: Diagnosis not present

## 2015-08-10 DIAGNOSIS — C8591 Non-Hodgkin lymphoma, unspecified, lymph nodes of head, face, and neck: Secondary | ICD-10-CM

## 2015-08-10 DIAGNOSIS — R918 Other nonspecific abnormal finding of lung field: Secondary | ICD-10-CM | POA: Diagnosis not present

## 2015-08-10 MED ORDER — IOPAMIDOL (ISOVUE-300) INJECTION 61%
100.0000 mL | Freq: Once | INTRAVENOUS | Status: AC | PRN
  Administered 2015-08-10: 100 mL via INTRAVENOUS

## 2015-08-11 ENCOUNTER — Telehealth: Payer: Self-pay | Admitting: Oncology

## 2015-08-11 ENCOUNTER — Encounter: Payer: Medicare Other | Admitting: Gynecology

## 2015-08-11 NOTE — Telephone Encounter (Signed)
left msg confirming 8/10 apt

## 2015-08-12 ENCOUNTER — Ambulatory Visit: Payer: Medicare Other | Admitting: Oncology

## 2015-08-13 ENCOUNTER — Ambulatory Visit (HOSPITAL_BASED_OUTPATIENT_CLINIC_OR_DEPARTMENT_OTHER): Payer: Medicare Other | Admitting: Oncology

## 2015-08-13 ENCOUNTER — Telehealth: Payer: Self-pay | Admitting: Oncology

## 2015-08-13 VITALS — BP 135/68 | HR 93 | Temp 97.9°F | Resp 18 | Ht 63.5 in | Wt 140.8 lb

## 2015-08-13 DIAGNOSIS — R160 Hepatomegaly, not elsewhere classified: Secondary | ICD-10-CM | POA: Diagnosis present

## 2015-08-13 DIAGNOSIS — K639 Disease of intestine, unspecified: Secondary | ICD-10-CM

## 2015-08-13 DIAGNOSIS — D72829 Elevated white blood cell count, unspecified: Secondary | ICD-10-CM

## 2015-08-13 DIAGNOSIS — R634 Abnormal weight loss: Secondary | ICD-10-CM

## 2015-08-13 DIAGNOSIS — Z8572 Personal history of non-Hodgkin lymphomas: Secondary | ICD-10-CM | POA: Diagnosis not present

## 2015-08-13 DIAGNOSIS — C787 Secondary malignant neoplasm of liver and intrahepatic bile duct: Secondary | ICD-10-CM

## 2015-08-13 NOTE — Telephone Encounter (Signed)
Gave pt cal & avs °

## 2015-08-13 NOTE — Progress Notes (Signed)
Hematology and Oncology Follow Up Visit  Melissa Mccormick [id number] [birth year] 82 y.o. 08/13/2015 1:48 PM    CC: [name], MD  [name], MD  [name], MD    Principle Diagnosis:   82 year old female with stage 1A low-grade non-Hodgkin's lymphoma, marginal zone subtype diagnosed in 08/2010.  She developed right-sided pleural-based lung mass likely inflammatory in nature diagnosed in 2015 which says have resolved. She developed GI symptoms including bloating and imaging studies in August 2017 showed a colon tumor and liver lesions suspicious for metastatic disease.  Prior Therapy: The patient is S/P an excisional biopsy that was done on September 02, 2010.  The case number was [id number] which showed a CD20 positive low grade non-Hodgkin's lymphoma and marginal zone subtype that is low grade and as mentioned showed a CD20 positive, CD79 positive without coexpression of CD5 and CD10.   Current therapy: Observation and surveillance.   Interim History: Melissa Mccormick presents today for follow up visit. During the last visit on 08/04/2015 , she complained of overall fatigue and tiredness as well as weight loss. Her laboratory testing at that time showed abnormal liver function tests. Her alkaline phosphatase was elevated at 663, AST was 120, ALT was 81 and a bilirubin of 1.24. Given these findings, she had an urgent CT scan done on 08/10/2015 and here to discuss the results.  Since the last visit, she was evaluated by Dr. Drema Dallas and was found to have a urinary tract infection and currently prescribed antibiotics. She still ambulating without any major difficulties but have reported symptoms of further decline clinically. She is having memory issues which is chronic in nature but seems to be worse at this time. She denied any hematochezia or melena. She denied any abdominal distention or pain. She continues to lose weight.   She does not report any headaches, blurry vision, syncope or  seizures. She is not reporting any chest pain.  She is not reporting any difficulty breathing. Did not report any symptomatic dysphagia.  Did not report any fevers, chills, night sweats. She is not reporting any nausea or vomiting. No abdominal pain. The rest of  review of systems unremarkable.  Medications: I have reviewed the patient's current medications.  Current Outpatient Prescriptions  Medication Sig Dispense Refill  . aspirin 81 MG tablet Take 81 mg by mouth daily.      Marland Kitchen BIOTIN 5000 PO Take 1 tablet by mouth daily.    . calcium citrate-vitamin D (CITRACAL+D) 315-200 MG-UNIT per tablet Take 1 tablet by mouth daily.     . Carboxymethylcellulose Sodium (REFRESH OP) Apply to eye as needed.      . Cholecalciferol (VITAMIN D-3 PO) Take 2,000 Int'l Units by mouth daily.      . fluticasone (FLONASE) 50 MCG/ACT nasal spray as needed.     . loratadine (CLARITIN) 10 MG tablet Take 10 mg by mouth daily.     Marland Kitchen losartan-hydrochlorothiazide (HYZAAR) 50-12.5 MG per tablet Take 1 tablet by mouth daily.      . Multiple Vitamin (MULTI-VITAMIN PO) Take by mouth.      . Multiple Vitamins-Minerals (PRESERVISION AREDS PO) Take 2 capsules by mouth daily.    . NON FORMULARY Take 1 tablet by mouth daily.    . OMEGA-3 KRILL OIL 300 MG CAPS Take 300 mg by mouth daily.      Marland Kitchen OVER THE COUNTER MEDICATION Take by mouth as needed. Emergen C mix when exposed to colds or flu     .  psyllium (METAMUCIL) 58.6 % powder Take 1 packet by mouth 3 (three) times daily. 1 tsp with water in the morning    . VIVELLE-DOT 0.075 MG/24HR PLACE 1 PATCH ONTO THE SKIN TWICE WEEKLY 8 patch 9   No current facility-administered medications for this visit.     Allergies:  Allergies  Allergen Reactions  . Latex Hives and Rash  . Codeine Hives and Rash  . Tape Rash    Past Medical History, Surgical history, Social history, and Family History were reviewed and updated.  Review of Systems:  Remaining ROS negative.  Physical  Exam: Blood pressure 135/68, pulse 93, temperature 97.9 F (36.6 C), temperature source Oral, resp. rate 18, height 5' 3.5" (1.613 m), weight 140 lb 12.8 oz (63.9 kg), SpO2 98 %. ECOG: 2 General appearance: Frail appearing woman . Comfortable without distress. Head: Normocephalic, without obvious abnormality no oral ulcers or lesions. Neck: no adenopathy or thyroid masses. Lymph nodes: Cervical, supraclavicular, and axillary nodes normal. Heart:regular rate and rhythm, S1, S2 normal, no murmur, click, rub or gallop Lung:chest clear, no wheezing, rales, normal symmetric air entry Abdomin: soft, non-tender, without masses or organomegaly no rebound or guarding. EXT:no erythema, induration, or nodules  CBC    Component Value Date/Time   WBC 15.1 (H) 08/04/2015 1007   WBC 11.2 (H) 07/17/2014 1220   RBC 4.11 08/04/2015 1007   RBC 4.54 07/17/2014 1220   HGB 11.8 08/04/2015 1007   HCT 35.5 08/04/2015 1007   PLT 333 08/04/2015 1007   MCV 86.4 08/04/2015 1007   MCH 28.7 08/04/2015 1007   MCH 30.0 07/17/2014 1220   MCHC 33.2 08/04/2015 1007   MCHC 32.7 07/17/2014 1220   RDW 14.9 (H) 08/04/2015 1007   LYMPHSABS 3.3 08/04/2015 1007   MONOABS 1.2 (H) 08/04/2015 1007   EOSABS 0.1 08/04/2015 1007   BASOSABS 0.1 08/04/2015 1007      Chemistry      Component Value Date/Time   NA 136 08/04/2015 1007   K 3.3 (L) 08/04/2015 1007   CL 106 07/17/2014 1220   CL 102 08/28/2011 1421   CO2 28 08/04/2015 1007   BUN 18.2 08/04/2015 1007   CREATININE 1.0 08/04/2015 1007      Component Value Date/Time   CALCIUM 10.6 (H) 08/04/2015 1007   ALKPHOS 663 (H) 08/04/2015 1007   AST 120 (H) 08/04/2015 1007   ALT 81 (H) 08/04/2015 1007   BILITOT 1.24 (H) 08/04/2015 1007     CLINICAL DATA:  Weight loss. Malignant lymphoma of head, face, and neck. Endometrial adenocarcinoma.  EXAM: CT CHEST, ABDOMEN, AND PELVIS WITH CONTRAST  TECHNIQUE: Multidetector CT imaging of the chest, abdomen and pelvis  was performed following the standard protocol during bolus administration of intravenous contrast.  CONTRAST:  135mL ISOVUE-300 IOPAMIDOL (ISOVUE-300) INJECTION 61%  COMPARISON:  Chest CT on 01/14/2014, and PET-CT on 09/24/2013  FINDINGS: CT CHEST FINDINGS  Cardiovascular: Normal heart size. Aortic atherosclerotic calcification and coronary artery calcification again noted.  Mediastinum/Lymph Nodes: No masses, pathologically enlarged lymph nodes, or other significant abnormality.  Lungs/Pleura: Mild bronchiectasis and nodular scarring in the right middle and lower lobes shows no significant change.  A new 7 mm pulmonary nodule is seen in the posterior right lower lobe on image 67/4. Two new pulmonary nodules measuring 5-6 mm seen in the left upper lobe on images 67 and 72 of series 4. A new 7 mm pulmonary nodule is seen in the medial left lower lobe on image 112/4. Small pulmonary  metastases cannot be excluded.  No evidence of pleural effusion.  Musculoskeletal: No chest wall mass or suspicious bone lesions identified.  CT ABDOMEN PELVIS FINDINGS  Hepatobiliary: Multiple new hypovascular masses are seen involving both right and left hepatic lobes diffusely, consistent with diffuse liver metastases. 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 Tiny calcified gallstones are seen without evidence of cholecystitis or biliary ductal dilatation.  Pancreas: No mass, inflammatory changes, or other significant abnormality.  Spleen: Within normal limits in size and appearance.  Adrenals/Urinary Tract: No masses identified. No evidence of hydronephrosis. Small right renal cyst noted.  Stomach/Bowel: No evidence of obstruction, inflammatory process, or abnormal fluid collections. Diffuse colonic diverticulosis is again demonstrated, without evidence of diverticulitis. A new short segment masslike area of wall thickening is seen involving  the ascending colon which measures approximately 3.4 x 5.7 cm on image 78/series 2. This is suspicious for a primary colon carcinoma.  Vascular/Lymphatic: No pathologically enlarged lymph nodes. No evidence of abdominal aortic aneurysm. Aortic atherosclerosis noted.  Reproductive: Prior hysterectomy noted. Adnexal regions are unremarkable in appearance.  Other: None.  Musculoskeletal:  No suspicious bone lesions identified.  IMPRESSION: New diffuse hypovascular liver metastases. 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  New short segment masslike area of wall thickening in the ascending colon, suspicious for primary colon carcinoma. Consider colonoscopy for further evaluation.  Several new bilateral sub-cm pulmonary nodules; pulmonary metastases cannot be excluded. Mild bronchiectasis and nodular scarring in the right middle and lower lobes show no significant change.  Aortic atherosclerosis and coronary artery calcification incidentally noted.  Impression and Plan:   This is a pleasant 82 year old female with the following diagnosis:   1. Stage 1A low-grade non-Hodgkin's lymphoma, marginal zone subtype. He is status post surgical resection 2012 and have been on observation and surveillance since that time without any evidence of recurrence.   Her CT scans and PET scan on 09/24/2013 did not show any evidence of disease.    2. Colon mass and multiple liver masses: These were detected on a CT scan on 08/10/2015. This was prompted by any symptoms of weight loss and progressive fatigue and abnormal liver function test.  These findings were reviewed today with the patient and her daughter. CT scan images were discussed as well. These findings suggest the diagnosis of stage IV colon cancer.  In order to confirm the diagnosis ideally, colonoscopy or a liver biopsy would be indicated. The natural course of this disease was also reviewed as well as  treatment options. Unfortunately we are dealing with a rather aggressive malignancy that was not detected on previous laboratory or imaging studies that has been done in the past. The treatment options are merely palliative which include systemic chemotherapy. The goal of therapy would be to palliate symptoms and potentially extend survival. There is no curative options at this time.  After discussion today, she have opted against biopsy or colonoscopy. It is a reasonable decision given the fact that she is not a great candidate for multi-agent systemic chemotherapy. I have recommended supportive care only and possible hospice enrollment if her symptoms progress.  I explained to her and her daughter that she would continue to decline over the next few months including more weight loss, fatigue and eventually she will be bedbound and ultimately die from her disease. I anticipate her life expectancy of 6 months or less.  She understands these findings today and she wishes not to have any aggressive therapy  or aggressive measures in case of cardiac or pulmonary failure. She has advanced directives and her wishes or consistent at this time.  3. Leukocytosis without lymphocytosis: Related to her malignancy and possible urinary tract infection.  4. Weight loss: We have discussed different strategies and attempt to boost her by mouth intake including scheduling meals and use nutritional supplements. This is related to her advanced malignancy.  5. Follow-up: Will be in 4 weeks to follow on her progress and address any symptoms that she might develop.  Melissa Button, MD 8/11/20171:48 PM

## 2015-08-16 ENCOUNTER — Encounter: Payer: Self-pay | Admitting: *Deleted

## 2015-08-17 ENCOUNTER — Encounter: Payer: Self-pay | Admitting: *Deleted

## 2015-08-18 ENCOUNTER — Encounter: Payer: Self-pay | Admitting: *Deleted

## 2015-08-23 ENCOUNTER — Telehealth: Payer: Self-pay | Admitting: *Deleted

## 2015-08-23 NOTE — Telephone Encounter (Signed)
[id number] with the daughter [name] to arrange home visit on [id number]/patient has been diagnosis with stage 4 colon cancer of the bowel with metastatic disease to the surrounding organs.Ameera Tigue,BSN,RN3,CCM,CN

## 2015-08-24 ENCOUNTER — Encounter: Payer: Self-pay | Admitting: *Deleted

## 2015-08-24 DIAGNOSIS — C189 Malignant neoplasm of colon, unspecified: Secondary | ICD-10-CM

## 2015-08-24 DIAGNOSIS — C787 Secondary malignant neoplasm of liver and intrahepatic bile duct: Principal | ICD-10-CM

## 2015-08-24 NOTE — Congregational Nurse Program (Signed)
Congregational Nurse Program Note  Date of Encounter: 08/24/2015  Past Medical History: Past Medical History:  Diagnosis Date  . Adenocarcinoma of endometrium ([place])   . Basal cell carcinoma   . Blood type A-   . Hypertension   . Lymphoma ([place])   . Lymphoma ([place])    No treatment at this time (Dr. Alen Blew)  . Mild hyperlipidemia   . Neck mass    Dr. Donne Hazel  . Varicose vein     Encounter Details:     CNP Questionnaire - 08/24/15 1751      Patient Demographics   Is this a new or existing patient? Existing   Patient is considered a/an Not Applicable   Race Caucasian/White     Patient Assistance   Location of Patient Assistance Not Applicable   Patient's financial/insurance status Private Insurance Coverage   Uninsured Patient No   Patient referred to apply for the following financial assistance Not Applicable   Food insecurities addressed Not Applicable   Transportation assistance No   Assistance securing medications No   Educational health offerings Cancer     Encounter Details   Primary purpose of visit Family/Caregiver Support   Was an Emergency Department visit averted? Not Applicable   Does patient have a medical provider? Yes   Patient referred to Not Applicable   Was a mental health screening completed? (GAINS tool) No   Does patient have dental issues? No   Does patient have vision issues? No   Does your patient have an abnormal blood pressure today? No   Since previous encounter, have you referred patient for abnormal blood pressure that resulted in a new diagnosis or medication change? No   Does your patient have an abnormal blood glucose today? No   Since previous encounter, have you referred patient for abnormal blood glucose that resulted in a new diagnosis or medication change? No   Was there a life-saving intervention made? No

## 2015-08-24 NOTE — Congregational Nurse Program (Signed)
Congregational Nurse Program Note  Date of Encounter: 08/24/2015  Past Medical History: Past Medical History:  Diagnosis Date  . Adenocarcinoma of endometrium ([place])   . Basal cell carcinoma   . Blood type A-   . Hypertension   . Lymphoma ([place])   . Lymphoma ([place])    No treatment at this time (Dr. Alen Blew)  . Mild hyperlipidemia   . Neck mass    Dr. Donne Hazel  . Varicose vein     Encounter Details:     CNP Questionnaire - 08/24/15 1751      Patient Demographics   Is this a new or existing patient? Existing   Patient is considered a/an Not Applicable   Race Caucasian/White     Patient Assistance   Location of Patient Assistance Not Applicable   Patient's financial/insurance status Private Insurance Coverage   Uninsured Patient No   Patient referred to apply for the following financial assistance Not Applicable   Food insecurities addressed Not Applicable   Transportation assistance No   Assistance securing medications No   Educational health offerings Cancer     Encounter Details   Primary purpose of visit Family/Caregiver Support   Was an Emergency Department visit averted? Not Applicable   Does patient have a medical provider? Yes   Patient referred to Not Applicable   Was a mental health screening completed? (GAINS tool) No   Does patient have dental issues? No   Does patient have vision issues? No   Does your patient have an abnormal blood pressure today? No   Since previous encounter, have you referred patient for abnormal blood pressure that resulted in a new diagnosis or medication change? No   Does your patient have an abnormal blood glucose today? No   Since previous encounter, have you referred patient for abnormal blood glucose that resulted in a new diagnosis or medication change? No   Was there a life-saving intervention made? No      [id number] Rosana Hoes, BSN,RN3,CCM,CN:/[id number]/Home visit with patient and family after recent diagnosis of rt. cva and  stage 4 colon cancer with mets to the surrounding organs.  Patient sleeping but awoke to heard prayer of support.  Spoke with the daughter all end of life issues are settled.  Will and funeral arrangements are in order.  Patient has opted not to seek chemotherapy due to the advance state of the cancer and the aggressive type of cell.  Gave my support and prayers for a graceful and quite time of transition from life to death for the patient.  Family thankful for the visit and will keep in contact for any needs.

## 2015-09-03 ENCOUNTER — Encounter: Payer: Self-pay | Admitting: *Deleted

## 2015-09-07 ENCOUNTER — Telehealth: Payer: Self-pay | Admitting: Oncology

## 2015-09-07 NOTE — Telephone Encounter (Signed)
09/15/2015, 02/24/2016 Appointments canceled per patient request. Patient's family member called to cancel appointment with no intent to reschedule. Patient family member stated there will be no need to reschedule.

## 2015-09-14 ENCOUNTER — Telehealth: Payer: Self-pay | Admitting: *Deleted

## 2015-09-14 NOTE — Telephone Encounter (Signed)
[id number] Melissa Mccormick,BSN,RN3,CCM:[phone]-Per the daughter patient is slowly ands quietly declining a little more each day.  Hx is of stage 4 colon ca with mets to the liver and surrounding organs

## 2015-09-15 ENCOUNTER — Ambulatory Visit: Payer: Medicare Other | Admitting: Oncology

## 2015-09-17 ENCOUNTER — Telehealth: Payer: Self-pay | Admitting: *Deleted

## 2015-09-17 DIAGNOSIS — C8591 Non-Hodgkin lymphoma, unspecified, lymph nodes of head, face, and neck: Secondary | ICD-10-CM

## 2015-09-17 MED ORDER — FLUCONAZOLE 10 MG/ML PO SUSR: 100.0000 mg | Freq: Every day | ORAL | 0 refills | Status: AC

## 2015-09-17 MED ORDER — FLUCONAZOLE 100 MG PO TABS: 100.0000 mg | ORAL_TABLET | Freq: Every day | ORAL | 0 refills | Status: DC

## 2015-09-17 NOTE — Telephone Encounter (Signed)
"  Hali Marry RN with [facility], [place]. Calling reporting patient has white patches in the rough of her mouth and  Coating her tongue.  Hospice does not cover medicine for thrush so calling for orders from Dr. Alen Blew.  Paged Dr. Alen Blew.  Verbal order received and read back from Dr. Alen Blew for oral Diflucan 100 mg po x 7 days.  Called and order given to [facility] at this time via her voicemail.  Received call back that "Family is crushing pills and need something to swab in her mouth."  Order changed to Suspended release liquid form.

## 2015-09-29 ENCOUNTER — Telehealth: Payer: Self-pay | Admitting: *Deleted

## 2015-09-29 NOTE — [facility] [facility] (Signed)
[id number] Davis,BSN,RN3,CCM: Melissa Mccormick the daughter to check on the family after patient's death/per martha everyone is doing as well as can be expected.  There were no needs church family and extended fmaily support has been strong.

## 2015-10-03 DEATH — deceased

## 2015-10-14 NOTE — Telephone Encounter (Signed)
[id number] family in order to check on welfare aftermdeath of the patient/per the daughter [name] all are doing well./[name], RN,CN

## 2016-02-04 ENCOUNTER — Ambulatory Visit: Payer: Medicare Other | Admitting: Oncology

## 2016-02-04 ENCOUNTER — Other Ambulatory Visit: Payer: Medicare Other

## 2017-01-27 IMAGING — CT CT CHEST W/ CM
3 of 5 series · 16 of 46 positions shown, 18 images · IV contrast (iopamidol)
Comparison: Chest CT on 01/14/2014, and PET-CT on 09/24/2013

CLINICAL DATA: Weight loss. Malignant lymphoma of head, face, and
neck. Endometrial adenocarcinoma.

EXAM:
CT CHEST, ABDOMEN, AND PELVIS WITH CONTRAST
TECHNIQUE: Multidetector CT imaging of the chest, abdomen and pelvis was
performed following the standard protocol during bolus
administration of intravenous contrast.
CONTRAST:  100mL XNGNGJ-REE IOPAMIDOL (XNGNGJ-REE) INJECTION 61%

[Series 2: cap with st · axial · 0.73mm/px · z∈[-576,-76]mm · 11 of 120 slices shown, 13 images]
[im 10/120  soft-tissue]
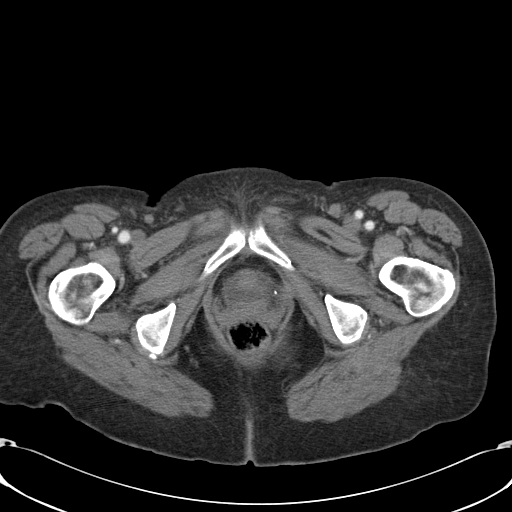
[im 10/120  bone]
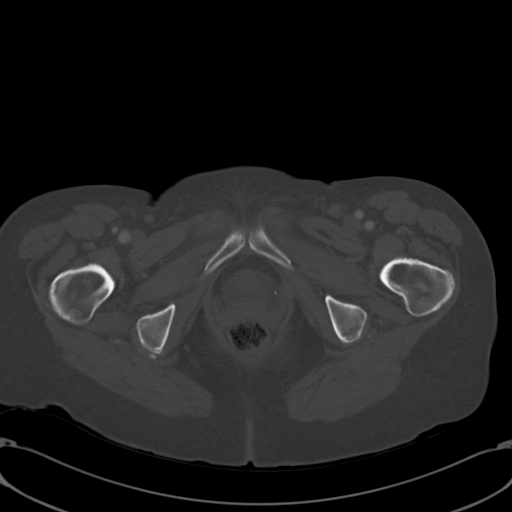
[im 20/120  soft-tissue]
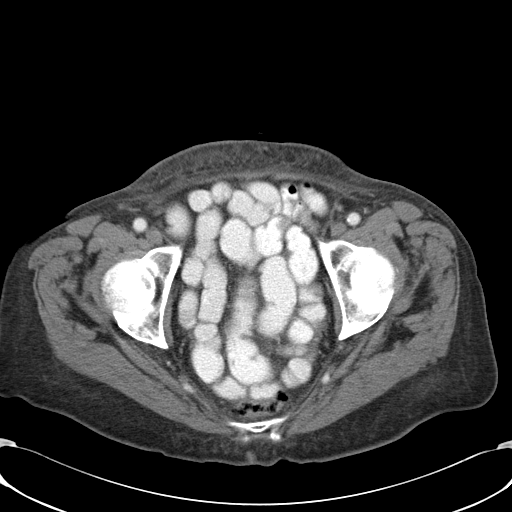
[im 30/120  soft-tissue]
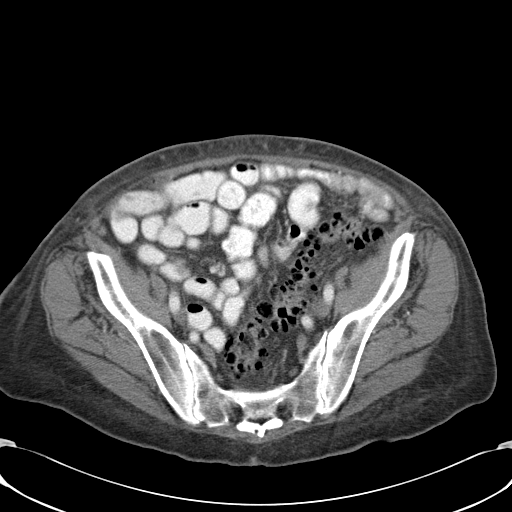
[im 40/120  soft-tissue]
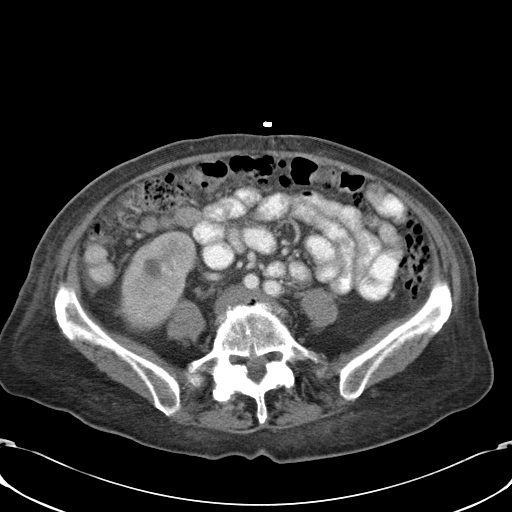
[im 50/120  soft-tissue]
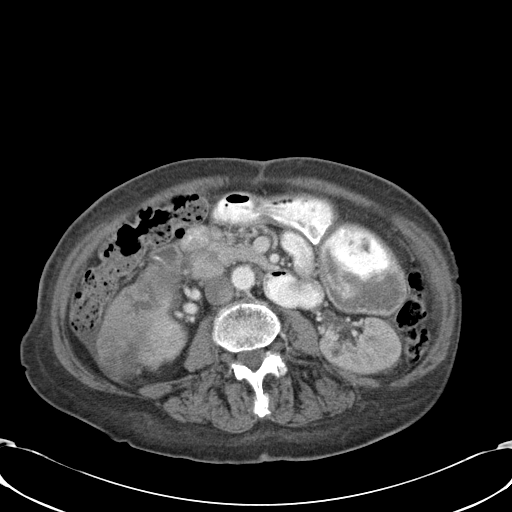
[im 60/120  soft-tissue]
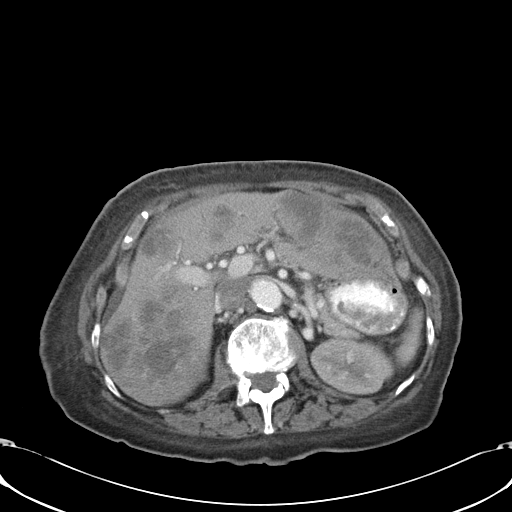
[im 70/120  soft-tissue]
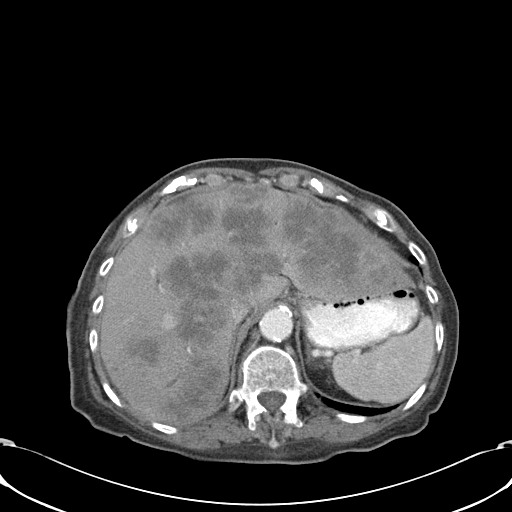
[im 80/120  soft-tissue]
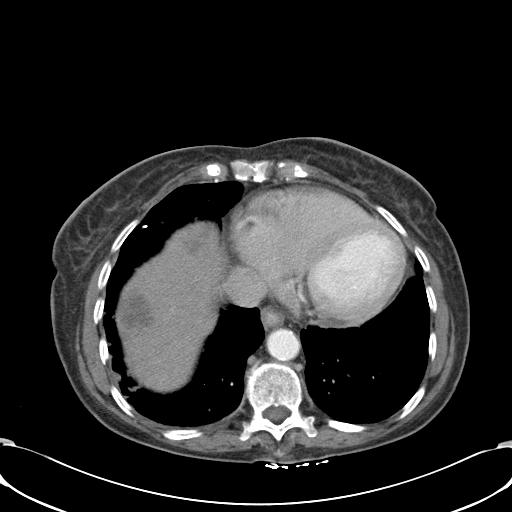
[im 90/120  soft-tissue]
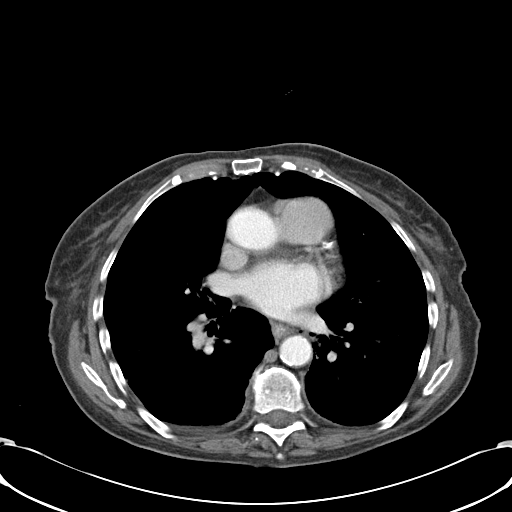
[im 90/120  bone]
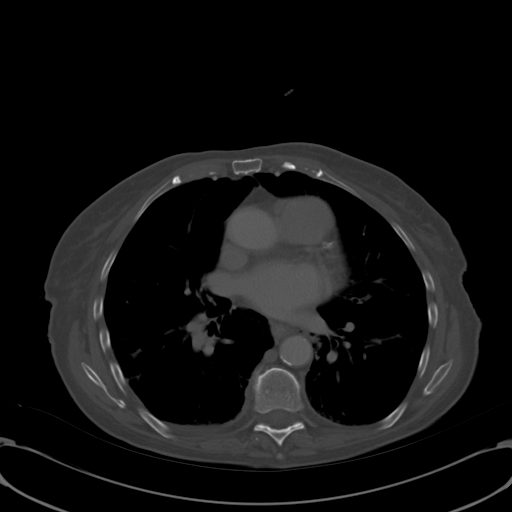
[im 100/120  soft-tissue]
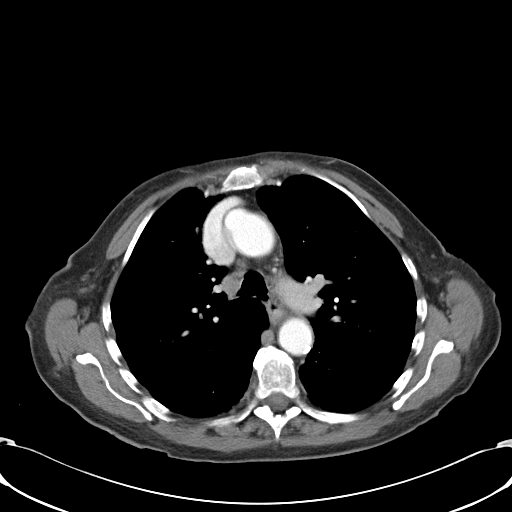
[im 110/120  soft-tissue]
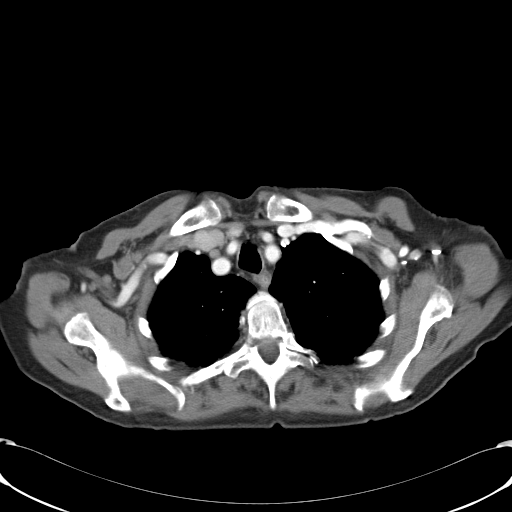

[Series 4: lung windows · axial · 0.73mm/px · z∈[-288,-248]mm · 2 of 142 slices shown]
[im 11/142  bone]
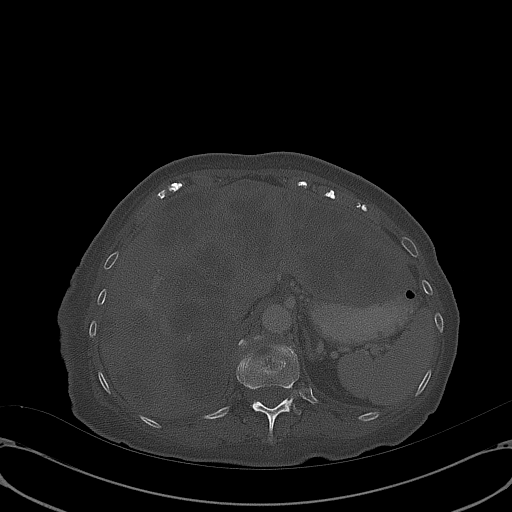
[im 31/142  bone]
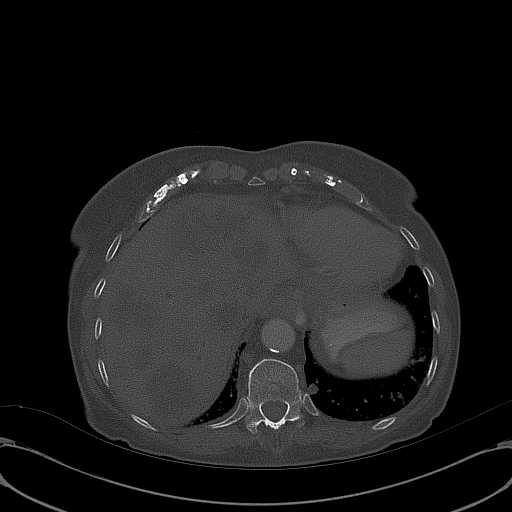

[Series 602: <mpr thick range> · coronal · 1.17mm/px · 3 of 126 slices shown]
[im 42/126  soft-tissue]
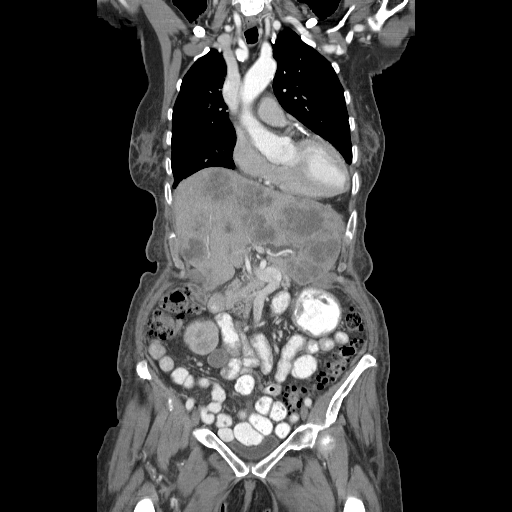
[im 56/126  soft-tissue]
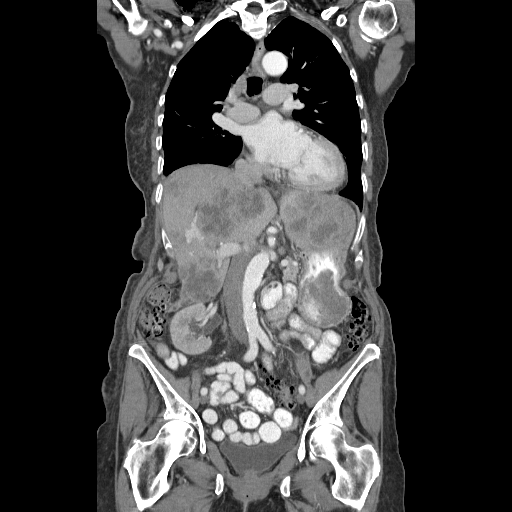
[im 70/126  soft-tissue]
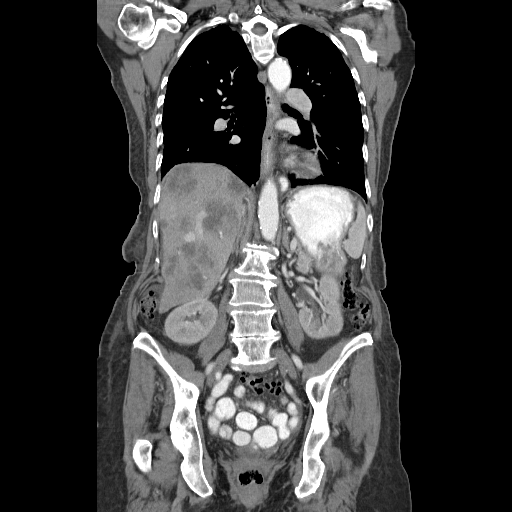

[16 of 46 positions shown; findings below may reference images not displayed]

FINDINGS: CT CHEST FINDINGS

Cardiovascular: Normal heart size. Aortic atherosclerotic
calcification and coronary artery calcification again noted.

Mediastinum/Lymph Nodes: No masses, pathologically enlarged lymph
nodes, or other significant abnormality.

Lungs/Pleura: Mild bronchiectasis and nodular scarring in the right
middle and lower lobes shows no significant change.

A new 7 mm pulmonary nodule is seen in the posterior right lower
lobe on image 67/4. Two new pulmonary nodules measuring 5-6 mm seen
in the left upper lobe on images 67 and 72 of series 4. A new 7 mm
pulmonary nodule is seen in the medial left lower lobe on image
112/4. Small pulmonary metastases cannot be excluded.

No evidence of pleural effusion.

Musculoskeletal: No chest wall mass or suspicious bone lesions
identified.

CT ABDOMEN PELVIS FINDINGS

Hepatobiliary: Multiple new hypovascular masses are seen involving
both right and left hepatic lobes diffusely, consistent with diffuse
liver metastases. Portal to hepatic vein fistula in right hepatic
lobe remains stable. Tiny calcified gallstones are seen without
evidence of cholecystitis or biliary ductal dilatation.

Pancreas: No mass, inflammatory changes, or other significant
abnormality.

Spleen: Within normal limits in size and appearance.

Adrenals/Urinary Tract: No masses identified. No evidence of
hydronephrosis. Small right renal cyst noted.

Stomach/Bowel: No evidence of obstruction, inflammatory process, or
abnormal fluid collections. Diffuse colonic diverticulosis is again
demonstrated, without evidence of diverticulitis. A new short
segment masslike area of wall thickening is seen involving the
ascending colon which measures approximately 3.4 x 5.7 cm on image
78/series 2. This is suspicious for a primary colon carcinoma.

Vascular/Lymphatic: No pathologically enlarged lymph nodes. No
evidence of abdominal aortic aneurysm. Aortic atherosclerosis noted.

Reproductive: Prior hysterectomy noted. Adnexal regions are
unremarkable in appearance.

Other: None.

Musculoskeletal:  No suspicious bone lesions identified.
IMPRESSION: New diffuse hypovascular liver metastases. Right hepatic lobe portal
hepatic vein fistula remains stable.

New short segment masslike area of wall thickening in the ascending
colon, suspicious for primary colon carcinoma. Consider colonoscopy
for further evaluation.

Several new bilateral sub-cm pulmonary nodules; pulmonary metastases
cannot be excluded. Mild bronchiectasis and nodular scarring in the
right middle and lower lobes show no significant change.

Aortic atherosclerosis and coronary artery calcification
incidentally noted.

## 2023-12-03 DEATH — deceased
